# Patient Record
Sex: Female | Born: 1935
Health system: Southern US, Community
[De-identification: ages and names within clinical notes are randomized; demographics above are authoritative.]

## PROBLEM LIST (undated history)

## (undated) DIAGNOSIS — M069 Rheumatoid arthritis, unspecified: Secondary | ICD-10-CM

## (undated) DIAGNOSIS — I10 Essential (primary) hypertension: Secondary | ICD-10-CM

## (undated) DIAGNOSIS — K219 Gastro-esophageal reflux disease without esophagitis: Secondary | ICD-10-CM

## (undated) DIAGNOSIS — J449 Chronic obstructive pulmonary disease, unspecified: Secondary | ICD-10-CM

## (undated) DIAGNOSIS — N390 Urinary tract infection, site not specified: Secondary | ICD-10-CM

## (undated) DIAGNOSIS — K5900 Constipation, unspecified: Secondary | ICD-10-CM

## (undated) DIAGNOSIS — J189 Pneumonia, unspecified organism: Secondary | ICD-10-CM

## (undated) HISTORY — DX: Pneumonia, unspecified organism: J18.9

## (undated) HISTORY — DX: Gastro-esophageal reflux disease without esophagitis: K21.9

## (undated) HISTORY — DX: Constipation, unspecified: K59.00

## (undated) HISTORY — DX: Chronic obstructive pulmonary disease, unspecified: J44.9

## (undated) HISTORY — DX: Urinary tract infection, site not specified: N39.0

## (undated) HISTORY — DX: Essential (primary) hypertension: I10

## (undated) HISTORY — DX: Rheumatoid arthritis, unspecified: M06.9

---

## 2005-05-03 ENCOUNTER — Ambulatory Visit: Payer: Self-pay | Admitting: Ophthalmology

## 2005-05-10 ENCOUNTER — Ambulatory Visit: Payer: Self-pay

## 2005-10-18 ENCOUNTER — Ambulatory Visit: Payer: Self-pay | Admitting: Internal Medicine

## 2006-07-29 ENCOUNTER — Ambulatory Visit: Payer: Self-pay | Admitting: Internal Medicine

## 2006-09-02 ENCOUNTER — Ambulatory Visit: Payer: Self-pay | Admitting: Unknown Physician Specialty

## 2006-10-04 ENCOUNTER — Ambulatory Visit: Payer: Self-pay | Admitting: Unknown Physician Specialty

## 2007-06-13 ENCOUNTER — Ambulatory Visit: Payer: Self-pay | Admitting: Internal Medicine

## 2007-09-04 ENCOUNTER — Ambulatory Visit: Payer: Self-pay | Admitting: Rheumatology

## 2007-09-13 ENCOUNTER — Ambulatory Visit: Payer: Self-pay | Admitting: Rheumatology

## 2007-10-09 ENCOUNTER — Ambulatory Visit: Payer: Self-pay

## 2007-10-13 ENCOUNTER — Ambulatory Visit: Payer: Self-pay

## 2007-11-08 ENCOUNTER — Ambulatory Visit: Payer: Self-pay | Admitting: Unknown Physician Specialty

## 2007-11-28 ENCOUNTER — Ambulatory Visit: Payer: Self-pay | Admitting: Internal Medicine

## 2007-12-28 ENCOUNTER — Ambulatory Visit: Payer: Self-pay | Admitting: Rheumatology

## 2008-11-29 ENCOUNTER — Ambulatory Visit: Payer: Self-pay | Admitting: Internal Medicine

## 2009-12-01 ENCOUNTER — Ambulatory Visit: Payer: Self-pay | Admitting: Internal Medicine

## 2009-12-03 ENCOUNTER — Ambulatory Visit: Payer: Self-pay | Admitting: Internal Medicine

## 2010-01-02 ENCOUNTER — Encounter: Admission: RE | Admit: 2010-01-02 | Discharge: 2010-01-02 | Payer: Self-pay | Admitting: General Surgery

## 2010-06-12 ENCOUNTER — Ambulatory Visit: Payer: Self-pay | Admitting: Internal Medicine

## 2011-01-04 ENCOUNTER — Encounter
Admission: RE | Admit: 2011-01-04 | Discharge: 2011-01-04 | Payer: Self-pay | Source: Home / Self Care | Attending: Internal Medicine | Admitting: Internal Medicine

## 2012-04-11 ENCOUNTER — Other Ambulatory Visit: Payer: Self-pay | Admitting: Internal Medicine

## 2012-04-11 DIAGNOSIS — Z1231 Encounter for screening mammogram for malignant neoplasm of breast: Secondary | ICD-10-CM

## 2012-04-28 ENCOUNTER — Ambulatory Visit
Admission: RE | Admit: 2012-04-28 | Discharge: 2012-04-28 | Disposition: A | Discharge status: Home / Self Care | Payer: Medicare Other | Source: Ambulatory Visit | Attending: Internal Medicine | Admitting: Internal Medicine

## 2012-04-28 DIAGNOSIS — Z1231 Encounter for screening mammogram for malignant neoplasm of breast: Secondary | ICD-10-CM

## 2012-05-04 ENCOUNTER — Ambulatory Visit: Payer: Self-pay | Admitting: Rheumatology

## 2012-07-29 ENCOUNTER — Inpatient Hospital Stay: Payer: Self-pay | Admitting: Internal Medicine

## 2012-07-29 LAB — CBC
HCT: 36.5 % (ref 35.0–47.0)
HGB: 12.3 g/dL (ref 12.0–16.0)
MCH: 28.7 pg (ref 26.0–34.0)
MCHC: 33.7 g/dL (ref 32.0–36.0)
MCV: 85 fL (ref 80–100)
Platelet: 350 10*3/uL (ref 150–440)
RDW: 14.2 % (ref 11.5–14.5)
WBC: 17 10*3/uL — ABNORMAL HIGH (ref 3.6–11.0)

## 2012-07-29 LAB — URINALYSIS, COMPLETE
Bacteria: NONE SEEN
Bilirubin,UR: NEGATIVE
Blood: NEGATIVE
Ketone: NEGATIVE
Ph: 9 (ref 4.5–8.0)
RBC,UR: 5 /HPF (ref 0–5)
Specific Gravity: 1.012 (ref 1.003–1.030)
Squamous Epithelial: NONE SEEN

## 2012-07-29 LAB — COMPREHENSIVE METABOLIC PANEL
Albumin: 2.6 g/dL — ABNORMAL LOW (ref 3.4–5.0)
Anion Gap: 8 (ref 7–16)
Calcium, Total: 8.1 mg/dL — ABNORMAL LOW (ref 8.5–10.1)
EGFR (African American): >60
EGFR (Non-African Amer.): >60
Glucose: 105 mg/dL — ABNORMAL HIGH (ref 65–99)
Osmolality: 264 (ref 275–301)
Potassium: 4.2 mmol/L (ref 3.5–5.1)
Sodium: 133 mmol/L — ABNORMAL LOW (ref 136–145)
Total Protein: 7.8 g/dL (ref 6.4–8.2)

## 2012-07-29 LAB — TROPONIN I: Troponin-I: <0.02 ng/mL

## 2012-07-30 LAB — BASIC METABOLIC PANEL
Anion Gap: 8 (ref 7–16)
Calcium, Total: 7.8 mg/dL — ABNORMAL LOW (ref 8.5–10.1)
Co2: 24 mmol/L (ref 21–32)
Creatinine: 0.58 mg/dL — ABNORMAL LOW (ref 0.60–1.30)
EGFR (African American): >60
EGFR (Non-African Amer.): >60
Glucose: 99 mg/dL (ref 65–99)
Potassium: 3.5 mmol/L (ref 3.5–5.1)

## 2012-07-30 LAB — CBC WITH DIFFERENTIAL/PLATELET
Basophil %: 0.3 %
Eosinophil #: 0 10*3/uL (ref 0.0–0.7)
Eosinophil %: 0.4 %
HGB: 11.3 g/dL — ABNORMAL LOW (ref 12.0–16.0)
Lymphocyte #: 0.7 10*3/uL — ABNORMAL LOW (ref 1.0–3.6)
Lymphocyte %: 5.9 %
MCV: 86 fL (ref 80–100)
Monocyte %: 4.5 %
Neutrophil #: 10.2 10*3/uL — ABNORMAL HIGH (ref 1.4–6.5)
Neutrophil %: 88.9 %
RBC: 3.98 10*6/uL (ref 3.80–5.20)

## 2012-07-30 LAB — URINE CULTURE

## 2012-07-31 LAB — CBC WITH DIFFERENTIAL/PLATELET
Basophil #: 0.1 10*3/uL (ref 0.0–0.1)
Basophil %: 0.5 %
HGB: 11.5 g/dL — ABNORMAL LOW (ref 12.0–16.0)
Lymphocyte %: 7.9 %
MCH: 28 pg (ref 26.0–34.0)
MCHC: 33 g/dL (ref 32.0–36.0)
Monocyte #: 0.6 x10 3/mm (ref 0.2–0.9)
Monocyte %: 4.9 %
Neutrophil #: 10.2 10*3/uL — ABNORMAL HIGH (ref 1.4–6.5)
Neutrophil %: 86.4 %
Platelet: 316 10*3/uL (ref 150–440)
RBC: 4.09 10*6/uL (ref 3.80–5.20)
RDW: 14 % (ref 11.5–14.5)
WBC: 11.8 10*3/uL — ABNORMAL HIGH (ref 3.6–11.0)

## 2012-07-31 LAB — BASIC METABOLIC PANEL
Chloride: 100 mmol/L (ref 98–107)
Co2: 24 mmol/L (ref 21–32)
EGFR (African American): >60
Glucose: 111 mg/dL — ABNORMAL HIGH (ref 65–99)
Osmolality: 265 (ref 275–301)

## 2012-08-01 LAB — POTASSIUM: Potassium: 3.4 mmol/L — ABNORMAL LOW (ref 3.5–5.1)

## 2012-08-01 LAB — MAGNESIUM: Magnesium: 1.9 mg/dL

## 2012-08-02 LAB — CBC WITH DIFFERENTIAL/PLATELET
Basophil %: 0.4 %
Eosinophil #: 0.1 10*3/uL (ref 0.0–0.7)
Eosinophil %: 1.6 %
HCT: 33.8 % — ABNORMAL LOW (ref 35.0–47.0)
HGB: 11.5 g/dL — ABNORMAL LOW (ref 12.0–16.0)
Lymphocyte #: 1.1 10*3/uL (ref 1.0–3.6)
MCV: 84 fL (ref 80–100)
Monocyte #: 0.7 x10 3/mm (ref 0.2–0.9)
Monocyte %: 8.1 %
Neutrophil #: 6.9 10*3/uL — ABNORMAL HIGH (ref 1.4–6.5)
RBC: 4.02 10*6/uL (ref 3.80–5.20)
WBC: 8.9 10*3/uL (ref 3.6–11.0)

## 2012-08-02 LAB — BASIC METABOLIC PANEL
Anion Gap: 6 — ABNORMAL LOW (ref 7–16)
BUN: 4 mg/dL — ABNORMAL LOW (ref 7–18)
Calcium, Total: 8.1 mg/dL — ABNORMAL LOW (ref 8.5–10.1)
Chloride: 96 mmol/L — ABNORMAL LOW (ref 98–107)
Co2: 30 mmol/L (ref 21–32)
Osmolality: 261 (ref 275–301)
Potassium: 3.4 mmol/L — ABNORMAL LOW (ref 3.5–5.1)

## 2013-05-15 ENCOUNTER — Non-Acute Institutional Stay (SKILLED_NURSING_FACILITY): Payer: Medicare Other | Admitting: Nurse Practitioner

## 2013-05-15 ENCOUNTER — Encounter: Payer: Self-pay | Admitting: Nurse Practitioner

## 2013-05-15 DIAGNOSIS — N390 Urinary tract infection, site not specified: Secondary | ICD-10-CM

## 2013-05-15 DIAGNOSIS — J449 Chronic obstructive pulmonary disease, unspecified: Secondary | ICD-10-CM

## 2013-05-15 DIAGNOSIS — M25569 Pain in unspecified knee: Secondary | ICD-10-CM

## 2013-05-15 DIAGNOSIS — M25562 Pain in left knee: Secondary | ICD-10-CM

## 2013-05-15 DIAGNOSIS — E538 Deficiency of other specified B group vitamins: Secondary | ICD-10-CM | POA: Insufficient documentation

## 2013-05-15 DIAGNOSIS — I10 Essential (primary) hypertension: Secondary | ICD-10-CM

## 2013-05-15 DIAGNOSIS — M069 Rheumatoid arthritis, unspecified: Secondary | ICD-10-CM

## 2013-05-15 DIAGNOSIS — K219 Gastro-esophageal reflux disease without esophagitis: Secondary | ICD-10-CM

## 2013-05-15 DIAGNOSIS — K5909 Other constipation: Secondary | ICD-10-CM | POA: Insufficient documentation

## 2013-05-15 DIAGNOSIS — F329 Major depressive disorder, single episode, unspecified: Secondary | ICD-10-CM | POA: Insufficient documentation

## 2013-05-15 DIAGNOSIS — K5900 Constipation, unspecified: Secondary | ICD-10-CM

## 2013-05-15 HISTORY — DX: Rheumatoid arthritis, unspecified: M06.9

## 2013-05-15 HISTORY — DX: Gastro-esophageal reflux disease without esophagitis: K21.9

## 2013-05-15 HISTORY — DX: Essential (primary) hypertension: I10

## 2013-05-15 HISTORY — DX: Chronic obstructive pulmonary disease, unspecified: J44.9

## 2013-05-15 HISTORY — DX: Constipation, unspecified: K59.00

## 2013-05-15 HISTORY — DX: Urinary tract infection, site not specified: N39.0

## 2013-05-15 NOTE — Progress Notes (Signed)
  Subjective:    Patient ID: Kristen [name], female    DOB: 1934/12/26, 78 y.o.   MRN: [id number]  HPI Comments: Pt was seen today for left knee pain and swelling. Pt's sister says that pt has seen Dr. Ashok Norris Rheumatologist at [facility] for rheumatoid arthritis (on po Methotrexate), last summer for left knee aspiration.  Knee Pain  The incident occurred 3 to 5 days ago. The incident occurred at a nursing home. The injury mechanism is unknown. The pain is present in the left knee. The quality of the pain is described as aching. The pain is at a severity of 6/10. The pain is moderate. The pain has been constant since onset. Pertinent negatives include no loss of motion, loss of sensation, numbness or tingling. She reports no foreign bodies present. The symptoms are aggravated by movement, palpation and weight bearing. She has tried heat (tramadol prn.) for the symptoms. The treatment provided mild relief.      Review of Systems  Unable to perform ROS: Dementia  Neurological: Negative for tingling and numbness.       Objective:   Physical Exam  Vitals reviewed. Constitutional: She appears well-developed and well-nourished. No distress.  HENT:  Head: Normocephalic.  Eyes: Pupils are equal, round, and reactive to light.  Cardiovascular: Normal rate, regular rhythm and normal heart sounds.   Pulmonary/Chest: Effort normal and breath sounds normal.  Musculoskeletal: She exhibits edema and tenderness.  Neurological: She is alert.  Skin: Skin is warm and dry. She is not diaphoretic.  Psychiatric: She has a normal mood and affect.          Assessment & Plan:  Pt was seen for left knee pain and swelling, unknown if fall, trauma was involved. Pt unable to give hx due to dementia.  1) Will check cbc, bmp, uric acid level to r/o gout, in am. 2) check temp q shift x 3 days. 3) xray left knee due to swelling and pain. 4) will review labs and xrays, will send to rheumatologist if no  improvement for hx of RA.

## 2013-05-18 ENCOUNTER — Encounter: Payer: Self-pay | Admitting: Nurse Practitioner

## 2013-05-21 ENCOUNTER — Encounter: Payer: Self-pay | Admitting: Nurse Practitioner

## 2013-05-22 ENCOUNTER — Other Ambulatory Visit: Payer: Self-pay | Admitting: Rheumatology

## 2013-05-22 LAB — BODY FLUID CELL COUNT WITH DIFFERENTIAL
Basophil: 0 %
Eosinophil: 0 %
Lymphocytes: 5 %
Neutrophils: 85 %
Other Cells BF: 0 %
Other Mononuclear Cells: 10 %

## 2013-05-22 LAB — SYNOVIAL FLUID, CRYSTAL

## 2013-07-19 ENCOUNTER — Encounter: Payer: Self-pay | Admitting: Adult Health

## 2013-07-19 ENCOUNTER — Non-Acute Institutional Stay (SKILLED_NURSING_FACILITY): Payer: Medicare Other | Admitting: Adult Health

## 2013-07-19 DIAGNOSIS — I10 Essential (primary) hypertension: Secondary | ICD-10-CM

## 2013-07-19 DIAGNOSIS — K219 Gastro-esophageal reflux disease without esophagitis: Secondary | ICD-10-CM

## 2013-07-19 DIAGNOSIS — K5900 Constipation, unspecified: Secondary | ICD-10-CM

## 2013-07-19 DIAGNOSIS — M069 Rheumatoid arthritis, unspecified: Secondary | ICD-10-CM

## 2013-07-19 DIAGNOSIS — F329 Major depressive disorder, single episode, unspecified: Secondary | ICD-10-CM

## 2013-07-30 NOTE — Assessment & Plan Note (Signed)
Is stable will continue miralax daily and senna s 2 tabs daily

## 2013-07-30 NOTE — Assessment & Plan Note (Signed)
Her status is tsable will continue methotrexate 12.5 mg weekly with folic acid 2 mg daily

## 2013-07-30 NOTE — Assessment & Plan Note (Signed)
Is stable will continue protonix 40 mg twice daily and will monitor

## 2013-07-30 NOTE — Progress Notes (Signed)
Patient ID: Kristen [name], female   DOB: November 05, 1935, 78 y.o.   MRN: [id number]  [facility]  Allergies  Allergen Reactions  . Erythromycin Base   . Penicillins   . Sulfa Antibiotics       Chief Complaint  Patient presents with  . Medical Managment of Chronic Issues    HPI: She is being seen for the management of her chronic illnesses. There are no concerns being voiced by the nursing staff at this time. Overall her status remains unchanged.   Past Medical History  Diagnosis Date  . RA (rheumatoid arthritis) 05/15/2013  . HTN (hypertension) 05/15/2013  . GERD (gastroesophageal reflux disease) 05/15/2013  . COPD (chronic obstructive pulmonary disease) 05/15/2013  . Unspecified constipation 05/15/2013  . UTI (urinary tract infection) 05/15/2013    No past surgical history on file.  VITAL SIGNS BP 110/63  Pulse 70  Ht 5\' 5"  (1.651 m)  Wt 107 lb (48.535 kg)  BMI 17.81 kg/m2   Patient's Medications  New Prescriptions   No medications on file  Previous Medications   ACETAMINOPHEN (TYLENOL) 500 MG TABLET    Take 500 mg by mouth 2 (two) times daily.   AMLODIPINE (NORVASC) 5 MG TABLET    Take 5 mg by mouth daily.   CITALOPRAM (CELEXA) 20 MG TABLET    Take 20 mg by mouth daily.   CYANOCOBALAMIN (VITAMIN B-12 IJ)    Inject 1,000 mg as directed every 30 (thirty) days.   FOLIC ACID (FOLVITE) 1 MG TABLET    Take 1 mg by mouth daily.    METHOTREXATE (RHEUMATREX) 2.5 MG TABLET    Take 12.5 mg by mouth once a week.    METOCLOPRAMIDE (REGLAN) 5 MG TABLET    Take 2.5 mg by mouth 4 (four) times daily.    PANTOPRAZOLE (PROTONIX) 40 MG TABLET    Take 40 mg by mouth 2 (two) times daily.   POLYETHYLENE GLYCOL (MIRALAX / GLYCOLAX) PACKET    Take 17 g by mouth daily.   SENNOSIDES-DOCUSATE SODIUM (SENOKOT-S) 8.6-50 MG TABLET    Take 2 tablets by mouth at bedtime.    TRAMADOL (ULTRAM) 50 MG TABLET    Take 50 mg by mouth every 8 (eight) hours as needed for pain.  Modified Medications   No  medications on file  Discontinued Medications   POLYETHYLENE GLYCOL (MIRALAX / GLYCOLAX) PACKET    Take 17 g by mouth daily.    SIGNIFICANT DIAGNOSTIC EXAMS  05-22-13: Left knee x-ray: moderate to severe osteopenia; mild osteoarthritis no acute fracture malalignment.sall joint effusion  LABS REVIEWED;   05-15-13: urine culture: e-coli: macrobid 05-16-13: wbc 6.3; hgb 11.7; hct 352. ;mcv 85.4 ;plt 356; glucose 93; bun 11; creat 0.63; k+3.7 Na++137    Review of Systems  Constitutional: Negative for malaise/fatigue.  Respiratory: Negative for cough.   Cardiovascular: Negative for chest pain.  Gastrointestinal: Negative for heartburn.  Musculoskeletal: Negative for myalgias.  Skin: Negative.   Psychiatric/Behavioral: The patient does not have insomnia.     Physical Exam  Constitutional:  frail  Neck: Neck supple. No thyromegaly present.  Cardiovascular: Normal rate, regular rhythm and intact distal pulses.   Respiratory: Effort normal and breath sounds normal. No respiratory distress. She has no wheezes.  GI: Soft. Bowel sounds are normal. She exhibits no distension. There is no tenderness.  Musculoskeletal: She exhibits no edema.  Is able to move extremities  Neurological: She is alert.  Skin: Skin is warm.  ASSESSMENT/ PLAN:  HTN (hypertension) Is stable will continue norvasc 5 mg daily and will monitor  GERD (gastroesophageal reflux disease) Is stable will continue protonix 40 mg twice daily and will monitor  Depressive disorder, not elsewhere classified Her mood status is stable will continue celexa 20 mg daily   Unspecified constipation Is stable will continue miralax daily and senna s 2 tabs daily   RA (rheumatoid arthritis) Her status is tsable will continue methotrexate 12.5 mg weekly with folic acid 2 mg daily    Time spent with patient 50 minutes.

## 2013-07-30 NOTE — Assessment & Plan Note (Signed)
Is stable will continue norvasc 5 mg daily and will monitor

## 2013-07-30 NOTE — Assessment & Plan Note (Addendum)
Her mood status is stable will continue celexa 20 mg daily

## 2013-08-11 ENCOUNTER — Emergency Department (HOSPITAL_COMMUNITY): Payer: Medicare Other

## 2013-08-11 ENCOUNTER — Encounter (HOSPITAL_COMMUNITY): Payer: Self-pay | Admitting: *Deleted

## 2013-08-11 ENCOUNTER — Emergency Department (HOSPITAL_COMMUNITY)
Admission: EM | Admit: 2013-08-11 | Discharge: 2013-08-11 | Disposition: A | Discharge status: Other Acute Inpatient Hospital | Payer: Medicare Other | Attending: Emergency Medicine | Admitting: Emergency Medicine

## 2013-08-11 DIAGNOSIS — K5900 Constipation, unspecified: Secondary | ICD-10-CM | POA: Insufficient documentation

## 2013-08-11 DIAGNOSIS — Z880 Allergy status to penicillin: Secondary | ICD-10-CM | POA: Insufficient documentation

## 2013-08-11 DIAGNOSIS — R404 Transient alteration of awareness: Secondary | ICD-10-CM | POA: Insufficient documentation

## 2013-08-11 DIAGNOSIS — J449 Chronic obstructive pulmonary disease, unspecified: Secondary | ICD-10-CM | POA: Insufficient documentation

## 2013-08-11 DIAGNOSIS — N390 Urinary tract infection, site not specified: Secondary | ICD-10-CM | POA: Insufficient documentation

## 2013-08-11 DIAGNOSIS — Z79899 Other long term (current) drug therapy: Secondary | ICD-10-CM | POA: Insufficient documentation

## 2013-08-11 DIAGNOSIS — J4489 Other specified chronic obstructive pulmonary disease: Secondary | ICD-10-CM | POA: Insufficient documentation

## 2013-08-11 DIAGNOSIS — K219 Gastro-esophageal reflux disease without esophagitis: Secondary | ICD-10-CM | POA: Insufficient documentation

## 2013-08-11 DIAGNOSIS — F0390 Unspecified dementia without behavioral disturbance: Secondary | ICD-10-CM | POA: Insufficient documentation

## 2013-08-11 DIAGNOSIS — I10 Essential (primary) hypertension: Secondary | ICD-10-CM | POA: Insufficient documentation

## 2013-08-11 DIAGNOSIS — M069 Rheumatoid arthritis, unspecified: Secondary | ICD-10-CM | POA: Insufficient documentation

## 2013-08-11 DIAGNOSIS — Z87891 Personal history of nicotine dependence: Secondary | ICD-10-CM | POA: Insufficient documentation

## 2013-08-11 LAB — CBC
Hemoglobin: 13 g/dL (ref 12.0–15.0)
MCHC: 35.1 g/dL (ref 30.0–36.0)
RBC: 4.18 MIL/uL (ref 3.87–5.11)
WBC: 11.5 10*3/uL — ABNORMAL HIGH (ref 4.0–10.5)

## 2013-08-11 LAB — COMPREHENSIVE METABOLIC PANEL
ALT: 13 U/L (ref 0–35)
AST: 22 U/L (ref 0–37)
Calcium: 8.8 mg/dL (ref 8.4–10.5)
GFR calc Af Amer: >90 mL/min (ref >90)
Glucose, Bld: 123 mg/dL — ABNORMAL HIGH (ref 70–99)
Sodium: 135 mEq/L (ref 135–145)
Total Protein: 6.9 g/dL (ref 6.0–8.3)

## 2013-08-11 LAB — URINALYSIS, ROUTINE W REFLEX MICROSCOPIC
Hgb urine dipstick: NEGATIVE
Ketones, ur: NEGATIVE mg/dL
Specific Gravity, Urine: 1.012 (ref 1.005–1.030)

## 2013-08-11 LAB — URINE MICROSCOPIC-ADD ON

## 2013-08-11 MED ORDER — CIPROFLOXACIN HCL 500 MG PO TABS: 500.0000 mg | ORAL_TABLET | Freq: Two times a day (BID) | ORAL | Status: DC

## 2013-08-11 MED ORDER — CIPROFLOXACIN HCL 500 MG PO TABS
500.0000 mg | ORAL_TABLET | Freq: Two times a day (BID) | ORAL | Status: DC
  Administered 2013-08-11: 500 mg via ORAL
  Filled 2013-08-11: qty 1

## 2013-08-11 NOTE — ED Notes (Signed)
Report given to [facility]. Facility ready to accept patient at this time. [facility] called to transport patient.

## 2013-08-11 NOTE — ED Notes (Signed)
Per EMS, Staff at nursing home stated pt had a period of LOC that lasted approximatly 10 minutes.  Per staff the pt had drool coming from left side of her mouth and was completely unresponsive.  Stated pt come out of LOC one her own without any intervention and now has equal and strong grip strength and had no symptoms.

## 2013-08-11 NOTE — ED Provider Notes (Signed)
CSN: [id number]     Arrival date & time 08/11/13  4098 History     First MD Initiated Contact with Patient 08/11/13 0104     Chief Complaint  Patient presents with  . Loss of Consciousness   (Consider location/radiation/quality/duration/timing/severity/associated sxs/prior Treatment) HPI 78 yo female presents to the ER from nursing home via EMS with report of LOC with drooling lasting about 10 minutes.  Event occurred around 1130.  Pt was in bed, and staff were unable to rouse her.  Daughter reports pt is a heavy sleeper, and often drools when she sleeps.  She is usually asleep by 8 pm.  It is unclear why the nursing home woke her at this time.  Pt has dementia, cannot give any history.  No prior stroke history.  Daughter reports pt is at baseline.  Past Medical History  Diagnosis Date  . RA (rheumatoid arthritis) 05/15/2013  . HTN (hypertension) 05/15/2013  . GERD (gastroesophageal reflux disease) 05/15/2013  . COPD (chronic obstructive pulmonary disease) 05/15/2013  . Unspecified constipation 05/15/2013  . UTI (urinary tract infection) 05/15/2013   History reviewed. No pertinent past surgical history. No family history on file. History  Substance Use Topics  . Smoking status: Former Games developer  . Smokeless tobacco: Not on file  . Alcohol Use: No   OB History   Grav Para Term Preterm Abortions TAB SAB Ect Mult Living                 Review of Systems  Unable to perform ROS: Dementia    Allergies  Erythromycin base; Penicillins; and Sulfa antibiotics  Home Medications   Current Outpatient Rx  Name  Route  Sig  Dispense  Refill  . acetaminophen (TYLENOL) 500 MG tablet   Oral   Take 500 mg by mouth 2 (two) times daily. 0900 & 1700         . amLODipine (NORVASC) 5 MG tablet   Oral   Take 5 mg by mouth every morning.          . citalopram (CELEXA) 20 MG tablet   Oral   Take 20 mg by mouth every morning.          . Cyanocobalamin (VITAMIN B-12 IJ)   Injection  Inject 1,000 mg as directed every 30 (thirty) days. 15th of each month         . folic acid (FOLVITE) 1 MG tablet   Oral   Take 1 mg by mouth 2 (two) times daily. 0800 & 1700         . methotrexate (RHEUMATREX) 2.5 MG tablet   Oral   Take 12.5 mg by mouth once a week. mondays         . metoCLOPramide (REGLAN) 5 MG tablet   Oral   Take 2.5 mg by mouth 4 (four) times daily.          . pantoprazole (PROTONIX) 40 MG tablet   Oral   Take 40 mg by mouth 2 (two) times daily. 0630 & 1630         . polyethylene glycol (MIRALAX / GLYCOLAX) packet   Oral   Take 17 g by mouth every morning.          . sennosides-docusate sodium (SENOKOT-S) 8.6-50 MG tablet   Oral   Take 2 tablets by mouth at bedtime.          . traMADol (ULTRAM) 50 MG tablet   Oral   Take 50 mg  by mouth every 8 (eight) hours as needed for pain.         . ciprofloxacin (CIPRO) 500 MG tablet   Oral   Take 1 tablet (500 mg total) by mouth 2 (two) times daily.   13 tablet   0    BP 119/68  Pulse 89  Temp(Src) 98.2 F (36.8 C) (Oral)  Resp 12  SpO2 96% Physical Exam  Nursing note and vitals reviewed. Constitutional: She appears well-developed and well-nourished. No distress.  HENT:  Head: Normocephalic and atraumatic.  Right Ear: External ear normal.  Left Ear: External ear normal.  Nose: Nose normal.  Mouth/Throat: Oropharynx is clear and moist.  Eyes: Conjunctivae and EOM are normal. Pupils are equal, round, and reactive to light.  Neck: Normal range of motion. Neck supple. No JVD present. No tracheal deviation present. No thyromegaly present.  Cardiovascular: Normal rate, regular rhythm, normal heart sounds and intact distal pulses.  Exam reveals no gallop and no friction rub.   No murmur heard. Pulmonary/Chest: Effort normal and breath sounds normal. No stridor. No respiratory distress. She has no wheezes. She has no rales. She exhibits no tenderness.  Abdominal: Soft. Bowel sounds are  normal. She exhibits no distension and no mass. There is no tenderness. There is no rebound and no guarding.  Musculoskeletal: Normal range of motion. She exhibits no edema and no tenderness.  Lymphadenopathy:    She has no cervical adenopathy.  Neurological: She is alert. She exhibits normal muscle tone. Coordination normal.  Skin: Skin is warm and dry. No rash noted. No erythema. No pallor.  Psychiatric: She has a normal mood and affect. Her behavior is normal. Judgment and thought content normal.    ED Course   Procedures (including critical care time)  Labs Reviewed  URINALYSIS, ROUTINE W REFLEX MICROSCOPIC - Abnormal; Notable for the following:    APPearance CLOUDY (*)    Leukocytes, UA LARGE (*)    All other components within normal limits  CBC - Abnormal; Notable for the following:    WBC 11.5 (*)    All other components within normal limits  COMPREHENSIVE METABOLIC PANEL - Abnormal; Notable for the following:    Potassium 3.3 (*)    Glucose, Bld 123 (*)    Albumin 3.1 (*)    Total Bilirubin 0.2 (*)    GFR calc non Af Amer 84 (*)    All other components within normal limits  URINE MICROSCOPIC-ADD ON - Abnormal; Notable for the following:    Bacteria, UA MANY (*)    All other components within normal limits  URINE CULTURE  PROTIME-INR  POCT I-STAT TROPONIN I   Ct Head Wo Contrast  08/11/2013   CLINICAL DATA:  Loss of consciousness. Dementia.  EXAM: CT HEAD WITHOUT CONTRAST  TECHNIQUE: Contiguous axial images were obtained from the base of the skull through the vertex without intravenous contrast.  COMPARISON:  None.  FINDINGS: Diffusely enlarged ventricles and subarachnoid spaces. Patchy white matter low density in both cerebral hemispheres. No intracranial hemorrhage, mass lesion or CT evidence of acute infarction. Unremarkable bones. Marked left sphenoid sinus mucosal thickening with some central calcification. Mild are central line right sphenoid sinus mucosal thickening.   IMPRESSION: 1. No acute abnormality. 2. Moderate atrophy and chronic small vessel white matter ischemic changes. 3. Chronic sphenoid sinusitis. This includes the possibility of fungal sinusitis on the left.   Electronically Signed   By: Gordan Payment   On: 08/11/2013 02:04  Date: 08/11/2013  Rate: 93  Rhythm: normal sinus rhythm  QRS Axis: left  Intervals: PR prolonged  ST/T Wave abnormalities: normal  Conduction Disutrbances:none  Narrative Interpretation:   Old EKG Reviewed: none available   1. Urinary tract infection   2. Altered level of consciousness     MDM  78 yo female with episode of unclear unresponsiveness, syncope vs tia vs heavy sleep.  Workup here shows UTI.  Daughter is comfortable with limited workup and no admission, Does not want aggressive intervention.  Olivia Mackie, MD 08/11/13 [phone]

## 2013-08-13 NOTE — Progress Notes (Signed)
This encounter was created in error - please disregard.

## 2013-08-14 LAB — URINE CULTURE: Colony Count: >=100000

## 2013-08-15 NOTE — ED Notes (Signed)
Copy of labs faxed to [facility]

## 2013-08-15 NOTE — Progress Notes (Signed)
,   ED Antimicrobial Stewardship Positive Culture Follow Up   Kristen [name] is an 77 y.o. female who presented to [facility] on 08/11/2013 with a chief complaint of  Chief Complaint  Patient presents with  . Loss of Consciousness    Recent Results (from the past 720 hour(s))  URINE CULTURE     Status: None   Collection Time    08/11/13  2:59 AM      Result Value Range Status   Specimen Description URINE, RANDOM   Final   Special Requests Normal   Final   Culture  Setup Time     Final   Value: 08/11/2013 15:53     Performed at [facility] Count     Final   Value: >=100,000 COLONIES/ML     Performed at [facility]   Culture     Final   Value: ESCHERICHIA COLI     Performed at [facility]   Report Status 08/14/2013 FINAL   Final   Organism ID, Bacteria ESCHERICHIA COLI   Final    [x]  Treated with Cipro, organism resistant to prescribed antimicrobial  New antibiotic prescription: Levaquin 750mg  PO qday x 5 days.  Patient should stop taking Cipro.  ED Provider: Marlon Pel, PA-C  Sallee Provencal 08/15/2013, 11:24 AM Infectious Diseases Pharmacist Phone# [phone]

## 2013-09-25 ENCOUNTER — Other Ambulatory Visit: Payer: Self-pay | Admitting: *Deleted

## 2013-09-25 MED ORDER — LORAZEPAM 2 MG/ML IJ SOLN: INTRAMUSCULAR | Status: DC

## 2014-01-08 ENCOUNTER — Other Ambulatory Visit: Payer: Self-pay

## 2014-01-08 ENCOUNTER — Non-Acute Institutional Stay (SKILLED_NURSING_FACILITY): Payer: Medicare Other | Admitting: Adult Health

## 2014-01-08 DIAGNOSIS — F329 Major depressive disorder, single episode, unspecified: Secondary | ICD-10-CM

## 2014-01-08 DIAGNOSIS — K5900 Constipation, unspecified: Secondary | ICD-10-CM

## 2014-01-08 DIAGNOSIS — J449 Chronic obstructive pulmonary disease, unspecified: Secondary | ICD-10-CM

## 2014-01-08 DIAGNOSIS — J189 Pneumonia, unspecified organism: Secondary | ICD-10-CM

## 2014-01-08 DIAGNOSIS — K219 Gastro-esophageal reflux disease without esophagitis: Secondary | ICD-10-CM

## 2014-01-08 DIAGNOSIS — R509 Fever, unspecified: Secondary | ICD-10-CM

## 2014-01-08 DIAGNOSIS — I10 Essential (primary) hypertension: Secondary | ICD-10-CM

## 2014-01-08 DIAGNOSIS — M069 Rheumatoid arthritis, unspecified: Secondary | ICD-10-CM

## 2014-01-08 DIAGNOSIS — F3289 Other specified depressive episodes: Secondary | ICD-10-CM

## 2014-01-08 LAB — URINALYSIS, COMPLETE
Bilirubin,UR: NEGATIVE
Glucose,UR: NEGATIVE mg/dL (ref 0–75)
Hyaline Cast: 15
Ketone: NEGATIVE
Nitrite: POSITIVE
Ph: 5 (ref 4.5–8.0)
RBC,UR: 19 /HPF (ref 0–5)
Specific Gravity: 1.014 (ref 1.003–1.030)
Squamous Epithelial: NONE SEEN
WBC UR: 254 /HPF (ref 0–5)

## 2014-01-08 LAB — COMPREHENSIVE METABOLIC PANEL
ALBUMIN: 3.4 g/dL (ref 3.4–5.0)
ALK PHOS: 79 U/L
ALT: 26 U/L (ref 12–78)
ANION GAP: 10 (ref 7–16)
AST: 28 U/L (ref 15–37)
BUN: 8 mg/dL (ref 7–18)
Bilirubin,Total: 0.4 mg/dL (ref 0.2–1.0)
CALCIUM: 9.1 mg/dL (ref 8.5–10.1)
Chloride: 100 mmol/L (ref 98–107)
Co2: 27 mmol/L (ref 21–32)
Creatinine: 1.05 mg/dL (ref 0.60–1.30)
EGFR (Non-African Amer.): 52 — ABNORMAL LOW
GFR CALC AF AMER: 60 — AB
Glucose: 130 mg/dL — ABNORMAL HIGH (ref 65–99)
Osmolality: 274 (ref 275–301)
Potassium: 4.1 mmol/L (ref 3.5–5.1)
SODIUM: 137 mmol/L (ref 136–145)
Total Protein: 7 g/dL (ref 6.4–8.2)

## 2014-01-08 LAB — CBC WITH DIFFERENTIAL/PLATELET
BASOS ABS: 0 10*3/uL (ref 0.0–0.1)
BASOS PCT: 0.2 %
Eosinophil #: 0 10*3/uL (ref 0.0–0.7)
Eosinophil %: 0 %
HCT: 40 % (ref 35.0–47.0)
HGB: 13.2 g/dL (ref 12.0–16.0)
Lymphocyte #: 0.3 10*3/uL — ABNORMAL LOW (ref 1.0–3.6)
Lymphocyte %: 1.9 %
MCH: 30.5 pg (ref 26.0–34.0)
MCHC: 33 g/dL (ref 32.0–36.0)
MCV: 92 fL (ref 80–100)
MONOS PCT: 2.7 %
Monocyte #: 0.5 x10 3/mm (ref 0.2–0.9)
NEUTROS PCT: 95.2 %
Neutrophil #: 16.6 10*3/uL — ABNORMAL HIGH (ref 1.4–6.5)
PLATELETS: 240 10*3/uL (ref 150–440)
RBC: 4.34 10*6/uL (ref 3.80–5.20)
RDW: 15.1 % — AB (ref 11.5–14.5)
WBC: 17.5 10*3/uL — AB (ref 3.6–11.0)

## 2014-01-10 LAB — URINE CULTURE

## 2014-01-11 ENCOUNTER — Non-Acute Institutional Stay (SKILLED_NURSING_FACILITY): Payer: Medicare Other | Admitting: Adult Health

## 2014-01-11 DIAGNOSIS — N390 Urinary tract infection, site not specified: Secondary | ICD-10-CM

## 2014-01-11 DIAGNOSIS — M549 Dorsalgia, unspecified: Secondary | ICD-10-CM

## 2014-01-11 MED ORDER — LIDOCAINE 5 % EX PTCH
1.0000 | MEDICATED_PATCH | CUTANEOUS | Status: DC
Start: 2014-01-11 — End: 2015-09-24

## 2014-01-11 NOTE — Progress Notes (Signed)
Patient ID: Kristen [name], female   DOB: 1935/02/24, 79 y.o.   MRN: [id number]     [facility]  Allergies  Allergen Reactions  . Erythromycin Base     Per MAR  . Penicillins     Per MAR  . Sulfa Antibiotics     Per [facility]     Chief Complaint  Patient presents with  . Acute Visit    HPI: She is complaining of back pain. She had been on ultram as needed in the past. The nursing staff reports that this medication was not effective for her. She tells me that her lower back hurts across the entire lower back. She is not voicing other complaints at this time. Her urine culture returned demonstrating e-coli.   Past Medical History  Diagnosis Date  . RA (rheumatoid arthritis) 05/15/2013  . HTN (hypertension) 05/15/2013  . GERD (gastroesophageal reflux disease) 05/15/2013  . COPD (chronic obstructive pulmonary disease) 05/15/2013  . Unspecified constipation 05/15/2013  . UTI (urinary tract infection) 05/15/2013    No past surgical history on file.  VITAL SIGNS There were no vitals taken for this visit.   Patient's Medications  New Prescriptions   No medications on file  Previous Medications   ACETAMINOPHEN (TYLENOL) 500 MG TABLET    Take 500 mg by mouth 2 (two) times daily. 0900 & 1700   CITALOPRAM (CELEXA) 20 MG TABLET    Take 20 mg by mouth every morning.    CYANOCOBALAMIN (VITAMIN B-12 IJ)    Inject 1,000 mg as directed every 30 (thirty) days. 15th of each month   FOLIC ACID (FOLVITE) 1 MG TABLET    Take 1 mg by mouth 2 (two) times daily. 0800 & 1700   LORAZEPAM (ATIVAN) 2 MG/ML INJECTION    Administer 0.24ml intramuscularly 30 minutes prior to dental appointment   METHOTREXATE (RHEUMATREX) 2.5 MG TABLET    Take 12.5 mg by mouth once a week. mondays   METOCLOPRAMIDE (REGLAN) 5 MG TABLET    Take 2.5 mg by mouth 4 (four) times daily.    PANTOPRAZOLE (PROTONIX) 40 MG TABLET    Take 40 mg by mouth daily. 0630 & 1630   POLYETHYLENE GLYCOL (MIRALAX / GLYCOLAX) PACKET    Take 17 g by  mouth every morning.    SENNOSIDES-DOCUSATE SODIUM (SENOKOT-S) 8.6-50 MG TABLET    Take 2 tablets by mouth at bedtime.   Modified Medications   No medications on file  Discontinued Medications   AMLODIPINE (NORVASC) 5 MG TABLET    Take 5 mg by mouth every morning.    CIPROFLOXACIN (CIPRO) 500 MG TABLET    Take 1 tablet (500 mg total) by mouth 2 (two) times daily.   TRAMADOL (ULTRAM) 50 MG TABLET    Take 50 mg by mouth every 8 (eight) hours as needed for pain.    SIGNIFICANT DIAGNOSTIC EXAMS  05-22-13: Left knee x-ray: moderate to severe osteopenia; mild osteoarthritis no acute fracture malalignment.sall joint effusion  01-08-14: chest x-ray: borderline heart size with slightly elevated pulmonary vasculature suggest mild chf. Very fine interstitial changes both lungs consistent with either pneumonia or interstitial pulmonary edema.     LABS REVIEWED;   05-15-13: urine culture: e-coli: macrobid 05-16-13: wbc 6.3; hgb 11.7; hct 352. ;mcv 85.4 ;plt 356; glucose 93; bun 11; creat 0.63; k+3.7 Na++137 07-20-13: glucose 88; bun 7; creat 0.6; k+3.6; na++ 137; vit b12: 1383 01-08-14; wbc 17.5; hgb 13.2; hct 40.0; mcv 92 plt 240; glucose 130; bun 8;  creat 1.08; k+4.1; na++137 liver normal albumin 3.4; u/a: + nitrate  Blood culture: no growth Urine culture: e-coli:  macorbid   Review of Systems  Constitutional: Negative.   Respiratory: Negative for cough.   Cardiovascular: Negative for chest pain.  Musculoskeletal: Positive for back pain and myalgias.  Skin: Negative.     Physical Exam  Constitutional: No distress.  frail  Neck: Neck supple. No JVD present.  Cardiovascular: Normal rate, regular rhythm and intact distal pulses.   Respiratory: Effort normal. No respiratory distress.  Slightly diminished breath sounds  GI: Soft. Bowel sounds are normal. She exhibits no distension. There is no tenderness.  Musculoskeletal: Normal range of motion.   Has bilateral tenderness to lower back to  palpation. Is able to get self upright in bed without difficulty.  Uses wheelchair  Neurological: She is alert.  Skin: Skin is dry. She is not diaphoretic.      ASSESSMENT/ PLAN:  1. UTI: will begin macrobid 100 mg twice daily for 7 days and will monitor  2. Back pain: will begin lidoderm patch daily to her lower back. Her bladder infection is more than likely contributing to her back pain at this time.

## 2014-01-13 ENCOUNTER — Encounter: Payer: Self-pay | Admitting: Adult Health

## 2014-01-13 DIAGNOSIS — J189 Pneumonia, unspecified organism: Secondary | ICD-10-CM

## 2014-01-13 DIAGNOSIS — R509 Fever, unspecified: Secondary | ICD-10-CM | POA: Insufficient documentation

## 2014-01-13 HISTORY — DX: Pneumonia, unspecified organism: J18.9

## 2014-01-13 LAB — CULTURE, BLOOD (SINGLE)

## 2014-01-13 NOTE — Progress Notes (Signed)
Patient ID: Kristen [name], female   DOB: Aug 10, 1935, 79 y.o.   MRN: [id number]     [facility]   Allergies  Allergen Reactions  . Erythromycin Base     Per MAR  . Penicillins     Per MAR  . Sulfa Antibiotics     Per MAR   . Chief Complaint  Patient presents with  . Medical Managment of Chronic Issues    HPI:  She is being seen for the management of her chronic illnesses. Staff reports that last night her 02 sats were low in the 80's and use of 02. She is running a fever this morning and is unable to participate in the hpi or ros.    Past Medical History  Diagnosis Date  . RA (rheumatoid arthritis) 05/15/2013  . HTN (hypertension) 05/15/2013  . GERD (gastroesophageal reflux disease) 05/15/2013  . COPD (chronic obstructive pulmonary disease) 05/15/2013  . Unspecified constipation 05/15/2013  . UTI (urinary tract infection) 05/15/2013    No past surgical history on file.  VITAL SIGNS BP 92/48  Pulse 68  Temp(Src) 101 F (38.3 C)  Ht 5\' 5"  (1.651 m)  Wt 109 lb 3.2 oz (49.533 kg)  BMI 18.17 kg/m2   Patient's Medications  New Prescriptions   No medications on file  Previous Medications   ACETAMINOPHEN (TYLENOL) 500 MG TABLET    Take 500 mg by mouth 2 (two) times daily. 0900 & 1700   CITALOPRAM (CELEXA) 20 MG TABLET    Take 20 mg by mouth every morning.    CYANOCOBALAMIN (VITAMIN B-12 IJ)    Inject 1,000 mg as directed every 30 (thirty) days. 15th of each month   FOLIC ACID (FOLVITE) 1 MG TABLET    Take 1 mg by mouth 2 (two) times daily. 0800 & 1700   LIDOCAINE (LIDODERM) 5 %    Place 1 patch onto the skin daily. Remove & Discard patch within 12 hours to her lower back   LORAZEPAM (ATIVAN) 2 MG/ML INJECTION    Administer 0.7ml intramuscularly 30 minutes prior to dental appointment   METHOTREXATE (RHEUMATREX) 2.5 MG TABLET    Take 12.5 mg by mouth once a week. mondays   METOCLOPRAMIDE (REGLAN) 5 MG TABLET    Take 2.5 mg by mouth 4 (four) times daily.    PANTOPRAZOLE  (PROTONIX) 40 MG TABLET    Take 40 mg by mouth daily. 0630 & 1630   POLYETHYLENE GLYCOL (MIRALAX / GLYCOLAX) PACKET    Take 17 g by mouth every morning.    SENNOSIDES-DOCUSATE SODIUM (SENOKOT-S) 8.6-50 MG TABLET    Take 2 tablets by mouth at bedtime.   Modified Medications   No medications on file  Discontinued Medications   No medications on file    SIGNIFICANT DIAGNOSTIC EXAMS  05-22-13: Left knee x-ray: moderate to severe osteopenia; mild osteoarthritis no acute fracture malalignment.sall joint effusion  01-08-14: chest x-ray: 1. Borderline heart size with elevated pulmonary vasculature suggest mild chf. 2. Very fine interstitial changes both lungs consistent with pneumonia or interstitial pulmonary edema.     LABS REVIEWED;   05-15-13: urine culture: e-coli: macrobid 05-16-13: wbc 6.3; hgb 11.7; hct 352. ;mcv 85.4 ;plt 356; glucose 93; bun 11; creat 0.63; k+3.7 Na++137 07-20-13: glucose 88; bun 7; creat 0.6; k+3.6; na++ 137; vit b12: 1383      Review of Systems  Unable to perform ROS   Physical Exam  Constitutional: No distress.  frail  Neck: Neck supple. No JVD present.  Cardiovascular: Normal rate, regular rhythm and intact distal pulses.   Respiratory: Effort normal. No respiratory distress.  Breath sounds diminished   GI: Soft. Bowel sounds are normal. She exhibits no distension. There is no tenderness.  Musculoskeletal: She exhibits no edema.  Is able to move all extremities.   Neurological: She is alert.  Skin: Skin is warm and dry. She is not diaphoretic.     ASSESSMENT/ PLAN:  1. For her fever: will get a cbc; cmp; u/a c&s; blood cultures will monitor her status  2. Pneumonia: will begin levaquin 500 mg daily with florastor twice daily for 10 days and will monitor her status   3. Hypertension: her blood pressure have been running low: will stop the norvasc and will continue to monitor her status.   4. [name]: will lower her protonix to 40 mg daily; will  continue reglan 2.5 mg four times daily  and will monitor  5. RA: no change in status: will continue her methotrexate 12.5 mg weekly with folic acid daily and will continue tylenol 500 mg twice daily and will monitor her status   6. Constipation: will continue miralax daily and will will continue senna s nightly and will monitor   7. Depression: she is presently stable will continue celexa 20 mg daily and will monitor her status.   Time spent with patient 45 minutes.

## 2014-01-28 ENCOUNTER — Encounter: Payer: Self-pay | Admitting: Adult Health

## 2014-01-28 ENCOUNTER — Non-Acute Institutional Stay (SKILLED_NURSING_FACILITY): Payer: Medicare Other | Admitting: Adult Health

## 2014-01-28 DIAGNOSIS — M549 Dorsalgia, unspecified: Secondary | ICD-10-CM

## 2014-01-28 DIAGNOSIS — R197 Diarrhea, unspecified: Secondary | ICD-10-CM

## 2014-01-28 MED ORDER — SENNA-DOCUSATE SODIUM 8.6-50 MG PO TABS: 2.0000 | ORAL_TABLET | Freq: Every day | ORAL | Status: DC | PRN

## 2014-01-28 MED ORDER — NAPROXEN SODIUM 220 MG PO TABS: 440.0000 mg | ORAL_TABLET | Freq: Two times a day (BID) | ORAL | Status: DC

## 2014-01-28 MED ORDER — POLYETHYLENE GLYCOL 3350 17 G PO PACK: 17.0000 g | PACK | Freq: Every day | ORAL | Status: DC | PRN

## 2014-01-28 NOTE — Progress Notes (Signed)
Patient ID: Kristen [name], female   DOB: [birth year], 79 y.o.   MRN: [id number]     [facility]  Allergies  Allergen Reactions  . Erythromycin Base     Per MAR  . Penicillins     Per MAR  . Sulfa Antibiotics     Per [facility]     Chief Complaint  Patient presents with  . Acute Visit    back pain loose stools     HPI:  Her family is concerned about her back pain and loose stools. Since she has completed her abt her stools have been loose. She is unable to tell me how many stools she is having per day. She is having back pain as well; which is not being managed with her current regimen. Her family is also concerned that she is spending nearly all of her time in the bed.  The staff tells me that she will decline to get out of bed and will cuss at the staff at times.  Nursing staff reports that she is weaker and is more confused since being treated for pneumonia ans uti.    Past Medical History  Diagnosis Date  . RA (rheumatoid arthritis) 05/15/2013  . HTN (hypertension) 05/15/2013  . GERD (gastroesophageal reflux disease) 05/15/2013  . COPD (chronic obstructive pulmonary disease) 05/15/2013  . Unspecified constipation 05/15/2013  . UTI (urinary tract infection) 05/15/2013    No past surgical history on file.  VITAL SIGNS BP 102/70  Pulse 74  Ht 5\' 5"  (1.651 m)  Wt 108 lb 4.8 oz (49.125 kg)  BMI 18.02 kg/m2   Patient's Medications  New Prescriptions   No medications on file  Previous Medications   ACETAMINOPHEN (TYLENOL) 500 MG TABLET    Take 500 mg by mouth 2 (two) times daily. 0900 & 1700   CITALOPRAM (CELEXA) 20 MG TABLET    Take 20 mg by mouth every morning.    CYANOCOBALAMIN (VITAMIN B-12 IJ)    Inject 1,000 mg as directed every 30 (thirty) days. 15th of each month   FOLIC ACID (FOLVITE) 1 MG TABLET    Take 1 mg by mouth 2 (two) times daily. 0800 & 1700   LIDOCAINE (LIDODERM) 5 %    Place 1 patch onto the skin daily. Remove & Discard patch within 12 hours to her lower  back   LORAZEPAM (ATIVAN) 2 MG/ML INJECTION    Administer 0.44ml intramuscularly 30 minutes prior to dental appointment   METHOTREXATE (RHEUMATREX) 2.5 MG TABLET    Take 12.5 mg by mouth once a week. mondays   METOCLOPRAMIDE (REGLAN) 5 MG TABLET    Take 2.5 mg by mouth 4 (four) times daily.    PANTOPRAZOLE (PROTONIX) 40 MG TABLET    Take 40 mg by mouth daily. 0630 & 1630   POLYETHYLENE GLYCOL (MIRALAX / GLYCOLAX) PACKET    Take 17 g by mouth every morning.    SENNOSIDES-DOCUSATE SODIUM (SENOKOT-S) 8.6-50 MG TABLET    Take 2 tablets by mouth at bedtime.   Modified Medications   No medications on file  Discontinued Medications   No medications on file    SIGNIFICANT DIAGNOSTIC EXAMS  05-22-13: Left knee x-ray: moderate to severe osteopenia; mild osteoarthritis no acute fracture malalignment.sall joint effusion  01-08-14: chest x-ray: borderline heart size with slightly elevated pulmonary vasculature suggest mild chf. Very fine interstitial changes both lungs consistent with either pneumonia or interstitial pulmonary edema.     LABS REVIEWED;   05-15-13: urine culture:  e-coli: macrobid 05-16-13: wbc 6.3; hgb 11.7; hct 352. ;mcv 85.4 ;plt 356; glucose 93; bun 11; creat 0.63; k+3.7 Na++137 07-20-13: glucose 88; bun 7; creat 0.6; k+3.6; na++ 137; vit b12: 1383 01-08-14; wbc 17.5; hgb 13.2; hct 40.0; mcv 92 plt 240; glucose 130; bun 8; creat 1.08; k+4.1; na++137 liver normal albumin 3.4; u/a: + nitrate  Blood culture: no growth Urine culture: e-coli:  macorbid 01-21-14: glucose 82; bun 13; creat 0.7; k+3.7; na++ 135 vit b12: 1151.0      Review of Systems  Constitutional: Negative.   Respiratory: Negative for cough.   Cardiovascular: Negative for chest pain.  Musculoskeletal: Positive for back pain and myalgias.  Skin: Negative.     Physical Exam  Constitutional: No distress.  frail  Neck: Neck supple. No JVD present.  Cardiovascular: Normal rate, regular rhythm and intact distal pulses.    Respiratory: Effort normal. No respiratory distress.  Slightly diminished breath sounds  GI: Soft. Bowel sounds are normal. She exhibits no distension. There is no tenderness.  Musculoskeletal: Normal range of motion.   Has bilateral tenderness to lower back to palpation. Is able to get self upright in bed without difficulty.  Uses wheelchair  Neurological: She is alert.  Skin: Skin is dry. She is not diaphoretic.     ASSESSMENT/ PLAN:  1. Back pain:  Will begin aleve 2 tabs twice daily for back pain; will have therapy evaluate and treat her back pain and will continue to monitor her status.   2. Diarrhea: will change her miralax and senna s to daily as needed at this time; will have nursing monitor her for signs of constipation and will treat as indicated.

## 2014-02-13 ENCOUNTER — Non-Acute Institutional Stay (SKILLED_NURSING_FACILITY): Payer: Medicare Other | Admitting: Adult Health

## 2014-02-13 DIAGNOSIS — J449 Chronic obstructive pulmonary disease, unspecified: Secondary | ICD-10-CM

## 2014-02-13 DIAGNOSIS — K5900 Constipation, unspecified: Secondary | ICD-10-CM

## 2014-02-13 DIAGNOSIS — K219 Gastro-esophageal reflux disease without esophagitis: Secondary | ICD-10-CM

## 2014-02-13 DIAGNOSIS — E538 Deficiency of other specified B group vitamins: Secondary | ICD-10-CM

## 2014-02-13 DIAGNOSIS — M549 Dorsalgia, unspecified: Secondary | ICD-10-CM

## 2014-02-13 DIAGNOSIS — F3289 Other specified depressive episodes: Secondary | ICD-10-CM

## 2014-02-13 DIAGNOSIS — M069 Rheumatoid arthritis, unspecified: Secondary | ICD-10-CM

## 2014-02-13 DIAGNOSIS — F329 Major depressive disorder, single episode, unspecified: Secondary | ICD-10-CM

## 2014-02-18 ENCOUNTER — Encounter: Payer: Self-pay | Admitting: Adult Health

## 2014-02-18 MED ORDER — METOCLOPRAMIDE HCL 5 MG PO TABS: 2.5000 mg | ORAL_TABLET | Freq: Three times a day (TID) | ORAL | Status: DC

## 2014-02-18 NOTE — Progress Notes (Signed)
Patient ID: Kristen [name], female   DOB: May 31, 1935, 79 y.o.   MRN: [id number]     [facility]  Allergies  Allergen Reactions  . Erythromycin Base     Per MAR  . Penicillins     Per MAR  . Sulfa Antibiotics     Per [facility]     Chief Complaint  Patient presents with  . Annual Exam    HPI:  She is being seen for her annual exam. Overall there has been little change in her status over the past month. She will need a screening mammogram. She does not want a bone density at this time and does not want a colonoscopy. There are no concerns being voiced by the nursing staff at this time. She states that her back pain is being managed at this time.    Past Medical History  Diagnosis Date  . RA (rheumatoid arthritis) 05/15/2013  . HTN (hypertension) 05/15/2013  . GERD (gastroesophageal reflux disease) 05/15/2013  . COPD (chronic obstructive pulmonary disease) 05/15/2013  . Unspecified constipation 05/15/2013  . UTI (urinary tract infection) 05/15/2013    No past surgical history on file.  VITAL SIGNS BP 102/70  Pulse 74  Ht 5\' 5"  (1.651 m)  Wt 108 lb 4.8 oz (49.125 kg)  BMI 18.02 kg/m2   Patient's Medications  New Prescriptions   No medications on file  Previous Medications   ACETAMINOPHEN (TYLENOL) 500 MG TABLET    Take 500 mg by mouth 2 (two) times daily. 0900 & 1700   CITALOPRAM (CELEXA) 20 MG TABLET    Take 20 mg by mouth every morning.    CYANOCOBALAMIN (VITAMIN B-12 IJ)    Inject 1,000 mg as directed every 30 (thirty) days. 15th of each month   FOLIC ACID (FOLVITE) 1 MG TABLET    Take 1 mg by mouth 2 (two) times daily. 0800 & 1700   LIDOCAINE (LIDODERM) 5 %    Place 1 patch onto the skin daily. Remove & Discard patch within 12 hours to her lower back   LORAZEPAM (ATIVAN) 2 MG/ML INJECTION    Administer 0.34ml intramuscularly 30 minutes prior to dental appointment   METHOTREXATE (RHEUMATREX) 2.5 MG TABLET    Take 12.5 mg by mouth once a week. mondays   METOCLOPRAMIDE  (REGLAN) 5 MG TABLET    Take 2.5 mg by mouth 4 (four) times daily.    NAPROXEN SODIUM (ALEVE) 220 MG TABLET    Take 2 tablets (440 mg total) by mouth 2 (two) times daily with a meal.   PANTOPRAZOLE (PROTONIX) 40 MG TABLET    Take 40 mg by mouth daily.   POLYETHYLENE GLYCOL (MIRALAX / GLYCOLAX) PACKET    Take 17 g by mouth daily as needed.   SENNOSIDES-DOCUSATE SODIUM (SENOKOT-S) 8.6-50 MG TABLET    Take 2 tablets by mouth daily as needed for constipation.  Modified Medications   No medications on file  Discontinued Medications   No medications on file    SIGNIFICANT DIAGNOSTIC EXAMS  05-22-13: Left knee x-ray: moderate to severe osteopenia; mild osteoarthritis no acute fracture malalignment.sall joint effusion  01-08-14: chest x-ray: borderline heart size with slightly elevated pulmonary vasculature suggest mild chf. Very fine interstitial changes both lungs consistent with either pneumonia or interstitial pulmonary edema.     LABS REVIEWED;   05-15-13: urine culture: e-coli: macrobid 05-16-13: wbc 6.3; hgb 11.7; hct 352. ;mcv 85.4 ;plt 356; glucose 93; bun 11; creat 0.63; k+3.7 Na++137 07-20-13: glucose 88; bun  7; creat 0.6; k+3.6; na++ 137; vit b12: 1383 01-08-14; wbc 17.5; hgb 13.2; hct 40.0; mcv 92 plt 240; glucose 130; bun 8; creat 1.08; k+4.1; na++137 liver normal albumin 3.4; u/a: + nitrate  Blood culture: no growth Urine culture: e-coli:  macorbid 01-21-14: glucose 82; bun 13; creat 0.7; k+3.7; na++ 135 vit b12: 1151.0      Review of Systems  Constitutional: Negative.   Respiratory: Negative for cough.   Cardiovascular: Negative for chest pain.  Musculoskeletal: negative for back pain and joint pain.  Skin: Negative.     Physical Exam  Constitutional: No distress.  frail  Neck: Neck supple. No JVD present.  Breast: breast exam negative  Cardiovascular: Normal rate, regular rhythm and intact distal pulses.   Respiratory: Effort normal. No respiratory distress.  Slightly  diminished breath sounds  GI: Soft. Bowel sounds are normal. She exhibits no distension. There is no tenderness.  Musculoskeletal: Normal range of motion. Uses wheelchair  Neurological: She is alert.  Skin: Skin is dry. She is not diaphoretic.      ASSESSMENT/ PLAN:  1. RA: no change in her status: will continue her methotrexate 12.5 mg weekly an folic acid 1 mg twice daily and will monitor her status   2. Back pain: her pain is being managed at this time; will continue aleve 2 tabs twice daily and tylenol 500 mg twice daily; lidoderm to her lower back and will monitor  3. [name]: is stable will continue protonix 40 mg daily will lower her reglan to 2.5 mg three times daily and will monitor her status.   4. Depression: she is stable; will continue her celexa 20 mg daily and will monitor   5. Constipation; she is having fewer stools since changing her miralax and senna s to daily as needed will not make changes.   6. Vit b12 deficiency: will continue monthly injections   7. Copd: is stable is presently not on medications; will not make changes will monitor her status.   Will setup a screening mammogram ;will guaiac stools X3; will stop her 02        Synthia Innocent NP [facility]  Contact [phone] Monday through Friday 8am- 5pm  After hours call [phone]

## 2014-02-19 ENCOUNTER — Other Ambulatory Visit: Payer: Self-pay | Admitting: Internal Medicine

## 2014-02-19 ENCOUNTER — Other Ambulatory Visit: Payer: Self-pay

## 2014-02-19 DIAGNOSIS — Z1231 Encounter for screening mammogram for malignant neoplasm of breast: Secondary | ICD-10-CM

## 2014-03-06 ENCOUNTER — Inpatient Hospital Stay: Admission: RE | Admit: 2014-03-06 | Payer: Medicare Other | Source: Ambulatory Visit

## 2014-03-11 ENCOUNTER — Non-Acute Institutional Stay (SKILLED_NURSING_FACILITY): Payer: Medicare Other | Admitting: Internal Medicine

## 2014-03-11 ENCOUNTER — Encounter: Payer: Self-pay | Admitting: Internal Medicine

## 2014-03-11 DIAGNOSIS — E538 Deficiency of other specified B group vitamins: Secondary | ICD-10-CM

## 2014-03-11 DIAGNOSIS — K219 Gastro-esophageal reflux disease without esophagitis: Secondary | ICD-10-CM

## 2014-03-11 DIAGNOSIS — F3289 Other specified depressive episodes: Secondary | ICD-10-CM

## 2014-03-11 DIAGNOSIS — F329 Major depressive disorder, single episode, unspecified: Secondary | ICD-10-CM

## 2014-03-11 DIAGNOSIS — M069 Rheumatoid arthritis, unspecified: Secondary | ICD-10-CM

## 2014-03-11 DIAGNOSIS — M549 Dorsalgia, unspecified: Secondary | ICD-10-CM

## 2014-03-11 NOTE — Progress Notes (Signed)
Patient ID: Kristen [name], female   DOB: [birth year], 79 y.o.   MRN: [id number]    [facility]  Chief Complaint  Patient presents with  . Medical Managment of Chronic Issues   Allergies  Allergen Reactions  . Erythromycin Base     Per MAR  . Penicillins     Per MAR  . Sulfa Antibiotics     Per MAR   Code- full code  HPI 79 y/o female pt is a long term care resident with history of RA, HTN, GERD among others. She is seen today for routine visit. As per staff, her po intake is poor. Review her weight over 3 months. She has lost 2 lbs and weighs 108 lb at present. She is mostly in bed and sleeping as per staff. No other concerns. Pt denies any complaints.  Review of Systems  Constitutional: Negative for fever, chills, diaphoresis.  HENT: Negative for congestion, hearing loss and sore throat.   Eyes: Negative for blurred vision, double vision and discharge.  Respiratory: Negative for cough, sputum production, shortness of breath and wheezing.   Cardiovascular: Negative for chest pain, palpitations, orthopnea and leg swelling.  Gastrointestinal: Negative for heartburn, nausea, vomiting, abdominal pain, diarrhea and constipation.  Genitourinary: Negative for dysuria Musculoskeletal: Negative for falls, joint pain and myalgias. her back pain is under control Skin: Negative for itching and rash.  Neurological: Negative for dizziness, tingling, focal weakness and headaches.  Psychiatric/Behavioral: The patient is not nervous/anxious.    Past Medical History  Diagnosis Date  . RA (rheumatoid arthritis) 05/15/2013  . HTN (hypertension) 05/15/2013  . GERD (gastroesophageal reflux disease) 05/15/2013  . COPD (chronic obstructive pulmonary disease) 05/15/2013  . Unspecified constipation 05/15/2013  . UTI (urinary tract infection) 05/15/2013   History reviewed. No pertinent past surgical history.  Current Outpatient Prescriptions on File Prior to Visit  Medication Sig Dispense  Refill  . acetaminophen (TYLENOL) 500 MG tablet Take 500 mg by mouth 2 (two) times daily. 0900 & 1700      . citalopram (CELEXA) 20 MG tablet Take 20 mg by mouth every morning.       . Cyanocobalamin (VITAMIN B-12 IJ) Inject 1,000 mg as directed every 30 (thirty) days. 15th of each month      . folic acid (FOLVITE) 1 MG tablet Take 1 mg by mouth 2 (two) times daily. 0800 & 1700      . lidocaine (LIDODERM) 5 % Place 1 patch onto the skin daily. Remove & Discard patch within 12 hours to her lower back  30 patch  0  . LORazepam (ATIVAN) 2 MG/ML injection Administer 0.55ml intramuscularly 30 minutes prior to dental appointment  1 mL  0  . methotrexate (RHEUMATREX) 2.5 MG tablet Take 12.5 mg by mouth once a week. mondays      . metoCLOPramide (REGLAN) 5 MG tablet Take 0.5 tablets (2.5 mg total) by mouth 3 (three) times daily before meals.  90 tablet  11  . naproxen sodium (ALEVE) 220 MG tablet Take 2 tablets (440 mg total) by mouth 2 (two) times daily with a meal.  60 tablet  11  . pantoprazole (PROTONIX) 40 MG tablet Take 40 mg by mouth daily. 0630 & 1630      . polyethylene glycol (MIRALAX / GLYCOLAX) packet Take 17 g by mouth daily as needed.  14 each  11  . sennosides-docusate sodium (SENOKOT-S) 8.6-50 MG tablet Take 2 tablets by mouth daily as needed for constipation.  60 tablet  11   No current facility-administered medications on file prior to visit.    Physical exam BP 118/70  Pulse 64  Temp(Src) 97.4 F (36.3 C)  Resp 18  Wt 108 lb (48.988 kg)  SpO2 94%  Constitutional: No distress. frail  Neck: Neck supple. No JVD present.  Breast: breast exam negative   Cardiovascular: Normal rate, regular rhythm and intact distal pulses.   Respiratory: Effort normal. No respiratory distress. No wheeze, rhonchi or crackles GI: Soft. Bowel sounds are normal. She exhibits no distension. There is no tenderness.  Musculoskeletal: Normal range of motion. Uses wheelchair  Neurological: She is alert.    Skin: Skin is dry. She is not diaphoretic.   Labs- 05-15-13: urine culture: e-coli: macrobid 05-16-13: wbc 6.3; hgb 11.7; hct 352. ;mcv 85.4 ;plt 356; glucose 93; bun 11; creat 0.63; k+3.7 Na++137 07-20-13: glucose 88; bun 7; creat 0.6; k+3.6; na++ 137; vit b12: 1383 01-08-14; wbc 17.5; hgb 13.2; hct 40.0; mcv 92 plt 240; glucose 130; bun 8; creat 1.08; k+4.1; na++137 liver normal albumin 3.4; u/a: + nitrate   Blood culture: no growth Urine culture: e-coli:  macorbid 01-21-14: glucose 82; bun 13; creat 0.7; k+3.7; na++ 135 vit b12: 1151.0   Assessment/plan  Depression- worsened. Will increase her celexa to 40 mg daily for now and reassess. If no improvement, consider psych consult  Vitamin b12 def- has normal b12 level. Will discontinue her b12 injection for now. Recheck b12 level in 3 months  RA- stable. continue methotrexate 12.5 mg once a week and folic acid 1 mg twice daily. Check cbc   Constipation- stable. Continue miralax and senna-s  GERD- stable. Continue protonix for now with reglan. Monitor clinically  Back pain- stable on aleve and tylenol with lidoderm for now. Given her age, will monitor renal function and h/h with alevel. Check cbc  Labs- cbc next lab, b12 in 3 months  [name], MD  [facility] [phone] (Monday-Friday 8 am - 5 pm) [phone] (afterhours)

## 2014-03-13 ENCOUNTER — Ambulatory Visit: Payer: Medicare Other

## 2014-03-20 ENCOUNTER — Emergency Department: Payer: Self-pay | Admitting: Emergency Medicine

## 2014-03-20 LAB — CBC WITH DIFFERENTIAL/PLATELET
Basophil #: 0 10*3/uL (ref 0.0–0.1)
Basophil %: 0.4 %
Eosinophil #: 0.6 10*3/uL (ref 0.0–0.7)
Eosinophil %: 7.6 %
HCT: 38.3 % (ref 35.0–47.0)
HGB: 12.4 g/dL (ref 12.0–16.0)
Lymphocyte #: 0.7 10*3/uL — ABNORMAL LOW (ref 1.0–3.6)
Lymphocyte %: 9.3 %
MCH: 30.3 pg (ref 26.0–34.0)
MCHC: 32.5 g/dL (ref 32.0–36.0)
MCV: 93 fL (ref 80–100)
MONO ABS: 0.4 x10 3/mm (ref 0.2–0.9)
MONOS PCT: 4.6 %
NEUTROS ABS: 6.2 10*3/uL (ref 1.4–6.5)
NEUTROS PCT: 78.1 %
PLATELETS: 263 10*3/uL (ref 150–440)
RBC: 4.11 10*6/uL (ref 3.80–5.20)
RDW: 14.9 % — ABNORMAL HIGH (ref 11.5–14.5)
WBC: 8 10*3/uL (ref 3.6–11.0)

## 2014-03-20 LAB — COMPREHENSIVE METABOLIC PANEL
ALK PHOS: 138 U/L — AB
AST: 21 U/L (ref 15–37)
Albumin: 3.2 g/dL — ABNORMAL LOW (ref 3.4–5.0)
Anion Gap: 7 (ref 7–16)
BUN: 12 mg/dL (ref 7–18)
Bilirubin,Total: 0.3 mg/dL (ref 0.2–1.0)
CO2: 28 mmol/L (ref 21–32)
Calcium, Total: 8.4 mg/dL — ABNORMAL LOW (ref 8.5–10.1)
Chloride: 101 mmol/L (ref 98–107)
Creatinine: 0.85 mg/dL (ref 0.60–1.30)
EGFR (Non-African Amer.): >60
Glucose: 121 mg/dL — ABNORMAL HIGH (ref 65–99)
OSMOLALITY: 273 (ref 275–301)
Potassium: 3.5 mmol/L (ref 3.5–5.1)
SGPT (ALT): 16 U/L (ref 12–78)
SODIUM: 136 mmol/L (ref 136–145)
TOTAL PROTEIN: 7 g/dL (ref 6.4–8.2)

## 2014-03-20 LAB — URINALYSIS, COMPLETE
Bilirubin,UR: NEGATIVE
GLUCOSE, UR: NEGATIVE mg/dL (ref 0–75)
Hyaline Cast: 2
Ketone: NEGATIVE
Nitrite: POSITIVE
Ph: 5 (ref 4.5–8.0)
Protein: 30
RBC,UR: 37 /HPF (ref 0–5)
Specific Gravity: 1.014 (ref 1.003–1.030)
Squamous Epithelial: NONE SEEN

## 2014-03-20 LAB — TROPONIN I: Troponin-I: <0.02 ng/mL

## 2014-03-22 LAB — URINE CULTURE

## 2014-04-10 ENCOUNTER — Encounter: Payer: Self-pay | Admitting: Adult Health

## 2014-04-10 ENCOUNTER — Non-Acute Institutional Stay (SKILLED_NURSING_FACILITY): Payer: Medicare Other | Admitting: Adult Health

## 2014-04-10 DIAGNOSIS — N390 Urinary tract infection, site not specified: Secondary | ICD-10-CM

## 2014-04-10 DIAGNOSIS — M549 Dorsalgia, unspecified: Secondary | ICD-10-CM

## 2014-04-10 DIAGNOSIS — F3289 Other specified depressive episodes: Secondary | ICD-10-CM

## 2014-04-10 DIAGNOSIS — M069 Rheumatoid arthritis, unspecified: Secondary | ICD-10-CM

## 2014-04-10 DIAGNOSIS — K5900 Constipation, unspecified: Secondary | ICD-10-CM

## 2014-04-10 DIAGNOSIS — J449 Chronic obstructive pulmonary disease, unspecified: Secondary | ICD-10-CM

## 2014-04-10 DIAGNOSIS — K219 Gastro-esophageal reflux disease without esophagitis: Secondary | ICD-10-CM

## 2014-04-10 DIAGNOSIS — F329 Major depressive disorder, single episode, unspecified: Secondary | ICD-10-CM

## 2014-04-10 NOTE — Progress Notes (Signed)
Patient ID: Kristen [name], female   DOB: September 13, 1935, 79 y.o.   MRN: [id number]     [facility]  Allergies  Allergen Reactions  . Erythromycin Base     Per MAR  . Penicillins     Per [facility]     Chief Complaint  Patient presents with  . Medical Management of Chronic Issues    HPI:  She is being seen for the management of her chronic illnesses. She has bene treated for an uti in the past couple of weeks.  She continues to spend a majority of her time in her room and in bed. There is concern about depression. She will need a nceps referral for further evaluation.    Past Medical History  Diagnosis Date  . RA (rheumatoid arthritis) 05/15/2013  . HTN (hypertension) 05/15/2013  . GERD (gastroesophageal reflux disease) 05/15/2013  . COPD (chronic obstructive pulmonary disease) 05/15/2013  . Unspecified constipation 05/15/2013  . UTI (urinary tract infection) 05/15/2013    No past surgical history on file.  VITAL SIGNS BP 100/77  Pulse 74  Ht 5\' 5"  (1.651 m)  Wt 107 lb 6.4 oz (48.716 kg)  BMI 17.87 kg/m2   Patient's Medications  New Prescriptions   No medications on file  Previous Medications   ACETAMINOPHEN (TYLENOL) 500 MG TABLET    Take 500 mg by mouth 2 (two) times daily. 0900 & 1700   CITALOPRAM (CELEXA) 20 MG TABLET    Take 20 mg by mouth every morning.    FOLIC ACID (FOLVITE) 1 MG TABLET    Take 1 mg by mouth 2 (two) times daily. 0800 & 1700   LIDOCAINE (LIDODERM) 5 %    Place 1 patch onto the skin daily. Remove & Discard patch within 12 hours to her lower back   METHOTREXATE (RHEUMATREX) 2.5 MG TABLET    Take 12.5 mg by mouth once a week. mondays   METOCLOPRAMIDE (REGLAN) 5 MG TABLET    Take 0.5 tablets (2.5 mg total) by mouth 3 (three) times daily before meals.   NAPROXEN SODIUM (ALEVE) 220 MG TABLET    Take 2 tablets (440 mg total) by mouth 2 (two) times daily with a meal.   PANTOPRAZOLE (PROTONIX) 40 MG TABLET    Take 40 mg by mouth daily. 0630 & 1630   POLYETHYLENE  GLYCOL (MIRALAX / GLYCOLAX) PACKET    Take 17 g by mouth daily as needed.   SENNOSIDES-DOCUSATE SODIUM (SENOKOT-S) 8.6-50 MG TABLET    Take 2 tablets by mouth daily as needed for constipation.  Modified Medications   No medications on file  Discontinued Medications   CYANOCOBALAMIN (VITAMIN B-12 IJ)    Inject 1,000 mg as directed every 30 (thirty) days. 15th of each month   LORAZEPAM (ATIVAN) 2 MG/ML INJECTION    Administer 0.38ml intramuscularly 30 minutes prior to dental appointment    SIGNIFICANT DIAGNOSTIC EXAMS  05-22-13: Left knee x-ray: moderate to severe osteopenia; mild osteoarthritis no acute fracture malalignment.sall joint effusion  01-08-14: chest x-ray: borderline heart size with slightly elevated pulmonary vasculature suggest mild chf. Very fine interstitial changes both lungs consistent with either pneumonia or interstitial pulmonary edema.     LABS REVIEWED;   05-15-13: urine culture: e-coli: macrobid 05-16-13: wbc 6.3; hgb 11.7; hct 352. ;mcv 85.4 ;plt 356; glucose 93; bun 11; creat 0.63; k+3.7 Na++137 07-20-13: glucose 88; bun 7; creat 0.6; k+3.6; na++ 137; vit b12: 1383 01-08-14; wbc 17.5; hgb 13.2; hct 40.0; mcv 92 plt 240;  glucose 130; bun 8; creat 1.08; k+4.1; na++137 liver normal albumin 3.4; u/a: + nitrate  Blood culture: no growth Urine culture: e-coli:  macorbid 01-21-14: glucose 82; bun 13; creat 0.7; k+3.7; na++ 135 vit b12: 1151.0  03-20-14; urine culture: e-coli: septra ds      Review of Systems  Constitutional: Negative.   Respiratory: Negative for cough.   Cardiovascular: Negative for chest pain.  Musculoskeletal: negative for back pain and joint pain.  Skin: Negative.     Physical Exam  Constitutional: No distress.  frail  Neck: Neck supple. No JVD present.  Breast: breast exam negative  Cardiovascular: Normal rate, regular rhythm and intact distal pulses.   Respiratory: Effort normal. No respiratory distress.  Slightly diminished breath sounds    GI: Soft. Bowel sounds are normal. She exhibits no distension. There is no tenderness.  Musculoskeletal: Normal range of motion. Uses wheelchair  Neurological: She is alert.  Skin: Skin is dry. She is not diaphoretic.      ASSESSMENT/ PLAN:  1. RA: no change in her status: will continue her methotrexate 12.5 mg weekly an folic acid 1 mg twice daily and will monitor her status   2. Back pain: her pain is being managed at this time; will continue aleve 2 tabs twice daily and tylenol 500 mg twice daily; lidoderm to her lower back and will monitor  3. [name]: is stable will continue protonix 40 mg daily will continue  reglan  2.5 mg three times daily and will monitor her status.   4. Depression: she is worse; will continue her celexa 20 mg daily and will have nceps follow up.    5. Constipation; will continue miralax and senna s to daily as needed will not make changes.   6. Vit b12 deficiency:  Is presently off injections will monitor   7. Copd: is stable is presently not on medications; will not make changes will monitor her status.     Synthia Innocent NP [facility]  Contact [phone] Monday through Friday 8am- 5pm  After hours call [phone]

## 2014-05-09 ENCOUNTER — Non-Acute Institutional Stay (SKILLED_NURSING_FACILITY): Payer: Medicare Other | Admitting: Adult Health

## 2014-05-09 DIAGNOSIS — K219 Gastro-esophageal reflux disease without esophagitis: Secondary | ICD-10-CM

## 2014-05-09 DIAGNOSIS — M549 Dorsalgia, unspecified: Secondary | ICD-10-CM

## 2014-05-09 DIAGNOSIS — J449 Chronic obstructive pulmonary disease, unspecified: Secondary | ICD-10-CM

## 2014-05-09 DIAGNOSIS — M069 Rheumatoid arthritis, unspecified: Secondary | ICD-10-CM

## 2014-05-09 DIAGNOSIS — F3289 Other specified depressive episodes: Secondary | ICD-10-CM

## 2014-05-09 DIAGNOSIS — I10 Essential (primary) hypertension: Secondary | ICD-10-CM

## 2014-05-09 DIAGNOSIS — F329 Major depressive disorder, single episode, unspecified: Secondary | ICD-10-CM

## 2014-05-10 LAB — BASIC METABOLIC PANEL
BUN: 11 mg/dL (ref 4–21)
CREATININE: 0.7 mg/dL (ref 0.5–1.1)
GLUCOSE: 78 mg/dL
POTASSIUM: 4.1 mmol/L (ref 3.4–5.3)
SODIUM: 135 mmol/L — AB (ref 137–147)

## 2014-05-10 LAB — CBC AND DIFFERENTIAL
HEMATOCRIT: 35 % — AB (ref 36–46)
HEMOGLOBIN: 11.3 g/dL — AB (ref 12.0–16.0)
PLATELETS: 268 10*3/uL (ref 150–399)
WBC: 9.7 10*3/mL

## 2014-05-10 LAB — HEPATIC FUNCTION PANEL
ALK PHOS: 68 U/L (ref 25–125)
ALT: 14 U/L (ref 7–35)
AST: 21 U/L (ref 13–35)
Bilirubin, Total: 0.4 mg/dL

## 2014-05-19 ENCOUNTER — Encounter: Payer: Self-pay | Admitting: Adult Health

## 2014-05-19 NOTE — Progress Notes (Signed)
Patient ID: GERAL TUCH, female   DOB: [birth year], 79 y.o.   MRN: [id number]     [facility]  Allergies  Allergen Reactions  . Erythromycin Base     Per MAR  . Penicillins     Per [facility]     Chief Complaint  Patient presents with  . Medical Management of Chronic Issues    HPI:  She is being seen for the management of her chronic illnesses. She has been stated on vit d for low level. She is being seen by nceps for her depression and is now taking cymbalta for her depression. She does tend to spend most her time in her bed per her choice. The staff does try to encourage her to get out of bed on a frequent basis.    Past Medical History  Diagnosis Date  . RA (rheumatoid arthritis) 05/15/2013  . HTN (hypertension) 05/15/2013  . GERD (gastroesophageal reflux disease) 05/15/2013  . COPD (chronic obstructive pulmonary disease) 05/15/2013  . Unspecified constipation 05/15/2013  . UTI (urinary tract infection) 05/15/2013    No past surgical history on file.  VITAL SIGNS BP 100/55  Pulse 64  Ht 5\' 5"  (1.651 m)  Wt 103 lb 12.8 oz (47.083 kg)  BMI 17.27 kg/m2  SpO2 93%   Patient's Medications  New Prescriptions   No medications on file  Previous Medications   ACETAMINOPHEN (TYLENOL) 500 MG TABLET    Take 500 mg by mouth 2 (two) times daily. 0900 & 1700   DULOXETINE (CYMBALTA) 20 MG CAPSULE    Take 40 mg by mouth daily.   Vitamin d 50,000 units weekly    FOLIC ACID (FOLVITE) 1 MG TABLET    Take 1 mg by mouth 2 (two) times daily. 0800 & 1700   LIDOCAINE (LIDODERM) 5 %    Place 1 patch onto the skin daily. Remove & Discard patch within 12 hours to her lower back   METHOTREXATE (RHEUMATREX) 2.5 MG TABLET    Take 12.5 mg by mouth once a week. mondays   METOCLOPRAMIDE (REGLAN) 5 MG TABLET    Take 0.5 tablets (2.5 mg total) by mouth 3 (three) times daily before meals.   NAPROXEN SODIUM (ALEVE) 220 MG TABLET    Take 2 tablets (440 mg total) by mouth 2 (two) times daily with a meal.   PANTOPRAZOLE (PROTONIX) 40 MG TABLET    Take 40 mg by mouth daily. 0630 & 1630   POLYETHYLENE GLYCOL (MIRALAX / GLYCOLAX) PACKET    Take 17 g by mouth daily as needed.   SENNOSIDES-DOCUSATE SODIUM (SENOKOT-S) 8.6-50 MG TABLET    Take 2 tablets by mouth daily as needed for constipation.  Modified Medications   No medications on file  Discontinued Medications   CITALOPRAM (CELEXA) 20 MG TABLET    Take 20 mg by mouth every morning.     SIGNIFICANT DIAGNOSTIC EXAMS   05-22-13: Left knee x-ray: moderate to severe osteopenia; mild osteoarthritis no acute fracture malalignment.sall joint effusion  01-08-14: chest x-ray: borderline heart size with slightly elevated pulmonary vasculature suggest mild chf. Very fine interstitial changes both lungs consistent with either pneumonia or interstitial pulmonary edema.     LABS REVIEWED;   05-15-13: urine culture: e-coli: macrobid 05-16-13: wbc 6.3; hgb 11.7; hct 352. ;mcv 85.4 ;plt 356; glucose 93; bun 11; creat 0.63; k+3.7 Na++137 07-20-13: glucose 88; bun 7; creat 0.6; k+3.6; na++ 137; vit b12: 1383 01-08-14; wbc 17.5; hgb 13.2; hct 40.0; mcv 92 plt 240;  glucose 130; bun 8; creat 1.08; k+4.1; na++137 liver normal albumin 3.4; u/a: + nitrate  Blood culture: no growth Urine culture: e-coli:  macorbid 01-21-14: glucose 82; bun 13; creat 0.7; k+3.7; na++ 135 vit b12: 1151.0  03-20-14; urine culture: e-coli: septra ds  04-15-14: tsh 3.631; vit d 18.18      Review of Systems  Constitutional: Negative.   Respiratory: Negative for cough.   Cardiovascular: Negative for chest pain.  Musculoskeletal: negative for back pain and joint pain.  Skin: Negative.     Physical Exam  Constitutional: No distress.  frail  Neck: Neck supple. No JVD present.  Breast: breast exam negative  Cardiovascular: Normal rate, regular rhythm and intact distal pulses.   Respiratory: Effort normal. No respiratory distress.  Slightly diminished breath sounds  GI: Soft. Bowel  sounds are normal. She exhibits no distension. There is no tenderness.  Musculoskeletal: Normal range of motion. Uses wheelchair  Neurological: She is alert.  Skin: Skin is dry. She is not diaphoretic.      ASSESSMENT/ PLAN:  1. RA: no change in her status: will continue her methotrexate 12.5 mg weekly an folic acid 1 mg twice daily and will monitor her status   2. Back pain: her pain is being managed at this time; will continue aleve 2 tabs twice daily and tylenol 500 mg twice daily; lidoderm to her lower back and will monitor  3. [name]: is stable will continue protonix 40 mg daily will continue  reglan  2.5 mg three times daily and will monitor her status.   4. Depression: she is followed by nceps; will continue her cymbalta 40 mg daily will monitor.    5. Constipation; will continue miralax and senna s to daily as needed will not make changes.   6. Vit b12 deficiency:  Is presently off injections will monitor   7. Copd: is stable is presently not on medications; will not make changes will monitor her status.  Will check cbc; cmp next draw    Synthia Innocent NP [facility]  Contact [phone] Monday through Friday 8am- 5pm  After hours call [phone]

## 2014-05-25 ENCOUNTER — Encounter (HOSPITAL_COMMUNITY): Payer: Self-pay | Admitting: Emergency Medicine

## 2014-05-25 ENCOUNTER — Emergency Department (HOSPITAL_COMMUNITY)
Admission: EM | Admit: 2014-05-25 | Discharge: 2014-05-25 | Disposition: A | Discharge status: Home / Self Care | Payer: Medicare Other | Attending: Emergency Medicine | Admitting: Emergency Medicine

## 2014-05-25 DIAGNOSIS — Z8701 Personal history of pneumonia (recurrent): Secondary | ICD-10-CM | POA: Diagnosis not present

## 2014-05-25 DIAGNOSIS — M129 Arthropathy, unspecified: Secondary | ICD-10-CM | POA: Diagnosis not present

## 2014-05-25 DIAGNOSIS — Z79899 Other long term (current) drug therapy: Secondary | ICD-10-CM | POA: Diagnosis not present

## 2014-05-25 DIAGNOSIS — Z87891 Personal history of nicotine dependence: Secondary | ICD-10-CM | POA: Diagnosis not present

## 2014-05-25 DIAGNOSIS — I10 Essential (primary) hypertension: Secondary | ICD-10-CM | POA: Diagnosis not present

## 2014-05-25 DIAGNOSIS — R4182 Altered mental status, unspecified: Secondary | ICD-10-CM | POA: Insufficient documentation

## 2014-05-25 DIAGNOSIS — N390 Urinary tract infection, site not specified: Secondary | ICD-10-CM | POA: Insufficient documentation

## 2014-05-25 DIAGNOSIS — Z880 Allergy status to penicillin: Secondary | ICD-10-CM | POA: Insufficient documentation

## 2014-05-25 DIAGNOSIS — K5900 Constipation, unspecified: Secondary | ICD-10-CM | POA: Diagnosis not present

## 2014-05-25 DIAGNOSIS — J449 Chronic obstructive pulmonary disease, unspecified: Secondary | ICD-10-CM | POA: Insufficient documentation

## 2014-05-25 DIAGNOSIS — K219 Gastro-esophageal reflux disease without esophagitis: Secondary | ICD-10-CM | POA: Insufficient documentation

## 2014-05-25 DIAGNOSIS — J4489 Other specified chronic obstructive pulmonary disease: Secondary | ICD-10-CM | POA: Insufficient documentation

## 2014-05-25 DIAGNOSIS — Z791 Long term (current) use of non-steroidal anti-inflammatories (NSAID): Secondary | ICD-10-CM | POA: Insufficient documentation

## 2014-05-25 DIAGNOSIS — Z792 Long term (current) use of antibiotics: Secondary | ICD-10-CM | POA: Insufficient documentation

## 2014-05-25 LAB — CBC
HCT: 35.9 % — ABNORMAL LOW (ref 36.0–46.0)
Hemoglobin: 11.9 g/dL — ABNORMAL LOW (ref 12.0–15.0)
MCH: 30.7 pg (ref 26.0–34.0)
MCHC: 33.1 g/dL (ref 30.0–36.0)
MCV: 92.5 fL (ref 78.0–100.0)
PLATELETS: 325 10*3/uL (ref 150–400)
RBC: 3.88 MIL/uL (ref 3.87–5.11)
RDW: 13.8 % (ref 11.5–15.5)
WBC: 9.3 10*3/uL (ref 4.0–10.5)

## 2014-05-25 LAB — URINALYSIS, ROUTINE W REFLEX MICROSCOPIC
Bilirubin Urine: NEGATIVE
GLUCOSE, UA: NEGATIVE mg/dL
Ketones, ur: NEGATIVE mg/dL
Nitrite: NEGATIVE
PROTEIN: NEGATIVE mg/dL
SPECIFIC GRAVITY, URINE: 1.013 (ref 1.005–1.030)
Urobilinogen, UA: 0.2 mg/dL (ref 0.0–1.0)
pH: 7.5 (ref 5.0–8.0)

## 2014-05-25 LAB — COMPREHENSIVE METABOLIC PANEL
ALBUMIN: 3.2 g/dL — AB (ref 3.5–5.2)
ALT: 14 U/L (ref 0–35)
AST: 26 U/L (ref 0–37)
Alkaline Phosphatase: 73 U/L (ref 39–117)
BILIRUBIN TOTAL: 0.2 mg/dL — AB (ref 0.3–1.2)
BUN: 8 mg/dL (ref 6–23)
CHLORIDE: 97 meq/L (ref 96–112)
CO2: 26 mEq/L (ref 19–32)
CREATININE: 0.69 mg/dL (ref 0.50–1.10)
Calcium: 9.2 mg/dL (ref 8.4–10.5)
GFR calc Af Amer: >90 mL/min (ref >90)
GFR calc non Af Amer: 82 mL/min — ABNORMAL LOW (ref >90)
Glucose, Bld: 95 mg/dL (ref 70–99)
Potassium: 4.4 mEq/L (ref 3.7–5.3)
Sodium: 134 mEq/L — ABNORMAL LOW (ref 137–147)
Total Protein: 6.8 g/dL (ref 6.0–8.3)

## 2014-05-25 LAB — URINE MICROSCOPIC-ADD ON

## 2014-05-25 MED ORDER — CEPHALEXIN 500 MG PO CAPS: 500.0000 mg | ORAL_CAPSULE | Freq: Four times a day (QID) | ORAL | Status: DC

## 2014-05-25 MED ORDER — DEXTROSE 5 % IV SOLN
1.0000 g | INTRAVENOUS | Status: DC
  Administered 2014-05-25: 1 g via INTRAVENOUS
  Filled 2014-05-25: qty 10

## 2014-05-25 NOTE — ED Provider Notes (Signed)
CSN: [id number]     Arrival date & time 05/25/14  1358 History   First MD Initiated Contact with Patient 05/25/14 1409     Chief Complaint  Patient presents with  . Altered Mental Status     (Consider location/radiation/quality/duration/timing/severity/associated sxs/prior Treatment) Patient is a 79 y.o. female presenting with altered mental status. The history is provided by the patient, a relative and the EMS personnel.  Altered Mental Status Presenting symptoms: confusion   Associated symptoms: no abdominal pain, no fever, no headaches, no rash and no vomiting   pt with hx copd, dementia, utis, presents from ecf w confusion, altered mental status.  Hx same. Pt states she feels fine - denies pain or other specific c/o. No fever or chills. No chest pain. No sob. No cough or uri c/o. No abd pain. No nv. Pt unaware of any change in meds. Family member notes hx utis, requests pcp eval for possible prophylactic abx.  Upon family member arrival states pts mental status in ED appears c/w baseline.     Past Medical History  Diagnosis Date  . RA (rheumatoid arthritis) 05/15/2013  . HTN (hypertension) 05/15/2013  . GERD (gastroesophageal reflux disease) 05/15/2013  . COPD (chronic obstructive pulmonary disease) 05/15/2013  . Unspecified constipation 05/15/2013  . UTI (urinary tract infection) 05/15/2013  . HCAP (healthcare-associated pneumonia) 01/13/2014   History reviewed. No pertinent past surgical history. No family history on file. History  Substance Use Topics  . Smoking status: Former Games developer  . Smokeless tobacco: Not on file  . Alcohol Use: No   OB History   Grav Para Term Preterm Abortions TAB SAB Ect Mult Living                 Review of Systems  Constitutional: Negative for fever and chills.  HENT: Negative for sore throat.   Eyes: Negative for visual disturbance.  Respiratory: Negative for cough and shortness of breath.   Cardiovascular: Negative for chest pain.   Gastrointestinal: Negative for vomiting, abdominal pain and diarrhea.  Genitourinary: Negative for dysuria and flank pain.  Musculoskeletal: Negative for back pain and neck pain.  Skin: Negative for rash.  Neurological: Negative for headaches.  Hematological: Does not bruise/bleed easily.  Psychiatric/Behavioral: Positive for confusion.      Allergies  Erythromycin base and Penicillins  Home Medications   Prior to Admission medications   Medication Sig Start Date End Date Taking? Authorizing Provider  acetaminophen (TYLENOL) 500 MG tablet Take 500 mg by mouth 2 (two) times daily. 0900 & 1700   Yes [name], NP  citalopram (CELEXA) 20 MG tablet Take 20 mg by mouth daily.   Yes Historical Provider, MD  DULoxetine (CYMBALTA) 20 MG capsule Take 40 mg by mouth daily.   Yes Historical Provider, MD  folic acid (FOLVITE) 1 MG tablet Take 1 mg by mouth 2 (two) times daily. 0800 & 1700   Yes [name], NP  lidocaine (LIDODERM) 5 % Place 1 patch onto the skin daily. Remove & Discard patch within 12 hours to her lower back 01/11/14  Yes [name], NP  methotrexate (RHEUMATREX) 2.5 MG tablet Take 12.5 mg by mouth once a week. mondays   Yes [name], NP  metoCLOPramide (REGLAN) 5 MG tablet Take 0.5 tablets (2.5 mg total) by mouth 3 (three) times daily before meals. 02/18/14  Yes [name], NP  naproxen sodium (ALEVE) 220 MG tablet Take 2 tablets (440 mg total) by mouth 2 (two) times daily  with a meal. 01/28/14  Yes [name], NP  pantoprazole (PROTONIX) 40 MG tablet Take 40 mg by mouth daily.    Yes [name], NP  polyethylene glycol (MIRALAX / GLYCOLAX) packet Take 17 g by mouth daily as needed. 01/28/14  Yes [name], NP  promethazine (PHENERGAN) 25 MG tablet Take 25 mg by mouth every 6 (six) hours as needed for nausea or vomiting.   Yes Historical Provider, MD  sennosides-docusate sodium (SENOKOT-S) 8.6-50 MG tablet Take 2 tablets by mouth daily as needed for  constipation. 01/28/14  Yes [name], NP  Vitamin D, Ergocalciferol, (DRISDOL) 50000 UNITS CAPS capsule Take 50,000 Units by mouth every 7 (seven) days.   Yes Historical Provider, MD   BP 132/87  Pulse 87  Temp(Src) 98.5 F (36.9 C) (Oral)  SpO2 95% Physical Exam  Nursing note and vitals reviewed. Constitutional: She appears well-developed and well-nourished. No distress.  HENT:  Mouth/Throat: Oropharynx is clear and moist.  Eyes: Conjunctivae are normal. No scleral icterus.  Neck: Neck supple. No tracheal deviation present.  No stiffness or rigidity  Cardiovascular: Normal rate, regular rhythm, normal heart sounds and intact distal pulses.   Pulmonary/Chest: Effort normal and breath sounds normal. No respiratory distress.  Abdominal: Soft. Normal appearance and bowel sounds are normal. She exhibits no distension and no mass. There is no tenderness. There is no rebound and no guarding.  Genitourinary:  No cva tenderness  Musculoskeletal: She exhibits no edema.  Neurological: She is alert.  Oriented to person and place. Moves bil ext purposefully w good str, sens intact.   Skin: Skin is warm and dry. No rash noted. She is not diaphoretic.  Psychiatric: She has a normal mood and affect.    ED Course  Procedures (including critical care time) Labs Review  Results for orders placed during the hospital encounter of 05/25/14  CBC      Result Value Ref Range   WBC 9.3  4.0 - 10.5 K/uL   RBC 3.88  3.87 - 5.11 MIL/uL   Hemoglobin 11.9 (*) 12.0 - 15.0 g/dL   HCT 31.5 (*) 94.5 - 85.9 %   MCV 92.5  78.0 - 100.0 fL   MCH 30.7  26.0 - 34.0 pg   MCHC 33.1  30.0 - 36.0 g/dL   RDW 29.2  44.6 - 28.6 %   Platelets 325  150 - 400 K/uL  COMPREHENSIVE METABOLIC PANEL      Result Value Ref Range   Sodium 134 (*) 137 - 147 mEq/L   Potassium 4.4  3.7 - 5.3 mEq/L   Chloride 97  96 - 112 mEq/L   CO2 26  19 - 32 mEq/L   Glucose, Bld 95  70 - 99 mg/dL   BUN 8  6 - 23 mg/dL   Creatinine,  Ser 3.81  0.50 - 1.10 mg/dL   Calcium 9.2  8.4 - 77.1 mg/dL   Total Protein 6.8  6.0 - 8.3 g/dL   Albumin 3.2 (*) 3.5 - 5.2 g/dL   AST 26  0 - 37 U/L   ALT 14  0 - 35 U/L   Alkaline Phosphatase 73  39 - 117 U/L   Total Bilirubin 0.2 (*) 0.3 - 1.2 mg/dL   GFR calc non Af Amer 82 (*) >90 mL/min   GFR calc Af Amer >90  >90 mL/min  URINALYSIS, ROUTINE W REFLEX MICROSCOPIC      Result Value Ref Range   Color, Urine  YELLOW  YELLOW   APPearance CLOUDY (*) CLEAR   Specific Gravity, Urine 1.013  1.005 - 1.030   pH 7.5  5.0 - 8.0   Glucose, UA NEGATIVE  NEGATIVE mg/dL   Hgb urine dipstick MODERATE (*) NEGATIVE   Bilirubin Urine NEGATIVE  NEGATIVE   Ketones, ur NEGATIVE  NEGATIVE mg/dL   Protein, ur NEGATIVE  NEGATIVE mg/dL   Urobilinogen, UA 0.2  0.0 - 1.0 mg/dL   Nitrite NEGATIVE  NEGATIVE   Leukocytes, UA LARGE (*) NEGATIVE  URINE MICROSCOPIC-ADD ON      Result Value Ref Range   Squamous Epithelial / LPF RARE  RARE   WBC, UA 21-50  <3 WBC/hpf   RBC / HPF 0-2  <3 RBC/hpf   Bacteria, UA MANY (*) RARE       MDM  Iv ns. Labs.  Reviewed nursing notes and prior charts for additional history.   uti on labs.  Rocephin iv.   Pt tolerating po fluids and food well during stay in ED.  Family arrives, feels mental state c/w baseline.  Vitals normal.   Pt appears stable for d/c.      Suzi Roots, MD 05/25/14 [phone]

## 2014-05-25 NOTE — Discharge Instructions (Signed)
Give keflex (antibiotic) as prescribed.  Urine tests were sent, the urine culture should be resulted in 2 days time - have your doctor follow up on that result this Monday. Please ensure that patient has access to adequate fluids - have water available at her bedside table. Follow up with primary care doctor regarding frequent urine infections - family asking about the possibility of low dose prophylactic antibiotic treatment, after course of treatment for this uti is completed.  Return to ER if worse, new symptoms, high fevers, change in mental status, persistent vomiting, trouble breathing, other concern.    Urinary Tract Infection Urinary tract infections (UTIs) can develop anywhere along your urinary tract. Your urinary tract is your body's drainage system for removing wastes and extra water. Your urinary tract includes two kidneys, two ureters, a bladder, and a urethra. Your kidneys are a pair of bean-shaped organs. Each kidney is about the size of your fist. They are located below your ribs, one on each side of your spine. CAUSES Infections are caused by microbes, which are microscopic organisms, including fungi, viruses, and bacteria. These organisms are so small that they can only be seen through a microscope. Bacteria are the microbes that most commonly cause UTIs. SYMPTOMS  Symptoms of UTIs may vary by age and gender of the patient and by the location of the infection. Symptoms in young women typically include a frequent and intense urge to urinate and a painful, burning feeling in the bladder or urethra during urination. Older women and men are more likely to be tired, shaky, and weak and have muscle aches and abdominal pain. A fever may mean the infection is in your kidneys. Other symptoms of a kidney infection include pain in your back or sides below the ribs, nausea, and vomiting. DIAGNOSIS To diagnose a UTI, your caregiver will ask you about your symptoms. Your caregiver also will ask  to provide a urine sample. The urine sample will be tested for bacteria and white blood cells. White blood cells are made by your body to help fight infection. TREATMENT  Typically, UTIs can be treated with medication. Because most UTIs are caused by a bacterial infection, they usually can be treated with the use of antibiotics. The choice of antibiotic and length of treatment depend on your symptoms and the type of bacteria causing your infection. HOME CARE INSTRUCTIONS  If you were prescribed antibiotics, take them exactly as your caregiver instructs you. Finish the medication even if you feel better after you have only taken some of the medication.  Drink enough water and fluids to keep your urine clear or pale yellow.  Avoid caffeine, tea, and carbonated beverages. They tend to irritate your bladder.  Empty your bladder often. Avoid holding urine for long periods of time.  Empty your bladder before and after sexual intercourse.  After a bowel movement, women should cleanse from front to back. Use each tissue only once. SEEK MEDICAL CARE IF:   You have back pain.  You develop a fever.  Your symptoms do not begin to resolve within 3 days. SEEK IMMEDIATE MEDICAL CARE IF:   You have severe back pain or lower abdominal pain.  You develop chills.  You have nausea or vomiting.  You have continued burning or discomfort with urination. MAKE SURE YOU:   Understand these instructions.  Will watch your condition.  Will get help right away if you are not doing well or get worse. Document Released: 09/15/2005 Document Revised: 06/06/2012 Document Reviewed: 01/14/2012  ExitCare Patient Information 2014 [place], [place].

## 2014-05-25 NOTE — ED Notes (Addendum)
Pt presents to department via [facility] for evaluation of altered mental status. Pt from [facility]. Family report patient wasn't acting like herself last night. History of UTI. Pt is alert, but confused upon arrival. 18g L forearm. CBG 108. BP 118/70.

## 2014-05-29 LAB — URINE CULTURE: Colony Count: >=100000

## 2014-06-02 ENCOUNTER — Telehealth (HOSPITAL_BASED_OUTPATIENT_CLINIC_OR_DEPARTMENT_OTHER): Payer: Self-pay | Admitting: Emergency Medicine

## 2014-06-02 NOTE — Telephone Encounter (Signed)
Per pharmacist, patient treated with Keflex. Sensitive to same. 

## 2014-06-11 ENCOUNTER — Non-Acute Institutional Stay (SKILLED_NURSING_FACILITY): Payer: Medicare Other | Admitting: Adult Health

## 2014-06-11 DIAGNOSIS — E538 Deficiency of other specified B group vitamins: Secondary | ICD-10-CM

## 2014-06-11 DIAGNOSIS — M545 Low back pain, unspecified: Secondary | ICD-10-CM

## 2014-06-11 DIAGNOSIS — J449 Chronic obstructive pulmonary disease, unspecified: Secondary | ICD-10-CM

## 2014-06-11 DIAGNOSIS — M069 Rheumatoid arthritis, unspecified: Secondary | ICD-10-CM

## 2014-06-11 DIAGNOSIS — K219 Gastro-esophageal reflux disease without esophagitis: Secondary | ICD-10-CM

## 2014-06-11 DIAGNOSIS — F3289 Other specified depressive episodes: Secondary | ICD-10-CM

## 2014-06-11 DIAGNOSIS — F329 Major depressive disorder, single episode, unspecified: Secondary | ICD-10-CM

## 2014-06-19 ENCOUNTER — Encounter: Payer: Self-pay | Admitting: Adult Health

## 2014-06-19 NOTE — Progress Notes (Signed)
Patient ID: Kristen [name], female   DOB: 09/13/1935, 79 y.o.   MRN: [id number]     [facility]  Allergies  Allergen Reactions  . Erythromycin Base     Per MAR  . Penicillins     Per [facility]     Chief Complaint  Patient presents with  . Medical Management of Chronic Issues    HPI:  No problem-specific assessment & plan notes found for this encounter.   Past Medical History  Diagnosis Date  . RA (rheumatoid arthritis) 05/15/2013  . HTN (hypertension) 05/15/2013  . GERD (gastroesophageal reflux disease) 05/15/2013  . COPD (chronic obstructive pulmonary disease) 05/15/2013  . Unspecified constipation 05/15/2013  . UTI (urinary tract infection) 05/15/2013  . HCAP (healthcare-associated pneumonia) 01/13/2014    No past surgical history on file.  VITAL SIGNS BP 106/56  Pulse 78  Ht 5\' 5"  (1.651 m)  Wt 105 lb 6.4 oz (47.809 kg)  BMI 17.54 kg/m2   Patient's Medications  New Prescriptions   No medications on file  Previous Medications   ACETAMINOPHEN (TYLENOL) 500 MG TABLET    Take 500 mg by mouth 2 (two) times daily. 0900 & 1700   DULOXETINE (CYMBALTA) 20 MG CAPSULE    Take 40 mg by mouth daily.   FOLIC ACID (FOLVITE) 1 MG TABLET    Take 1 mg by mouth 2 (two) times daily. 0800 & 1700   LIDOCAINE (LIDODERM) 5 %    Place 1 patch onto the skin daily. Remove & Discard patch within 12 hours to her lower back   METHOTREXATE (RHEUMATREX) 2.5 MG TABLET    Take 12.5 mg by mouth once a week. mondays   METOCLOPRAMIDE (REGLAN) 5 MG TABLET    Take 0.5 tablets (2.5 mg total) by mouth 3 (three) times daily before meals.   NAPROXEN SODIUM (ALEVE) 220 MG TABLET    Take 2 tablets (440 mg total) by mouth 2 (two) times daily with a meal.   PANTOPRAZOLE (PROTONIX) 40 MG TABLET    Take 40 mg by mouth daily.    POLYETHYLENE GLYCOL (MIRALAX / GLYCOLAX) PACKET    Take 17 g by mouth daily as needed.   PROMETHAZINE (PHENERGAN) 25 MG TABLET    Take 25 mg by mouth every 6 (six) hours as needed for nausea  or vomiting.   SENNOSIDES-DOCUSATE SODIUM (SENOKOT-S) 8.6-50 MG TABLET    Take 2 tablets by mouth daily as needed for constipation.   VITAMIN D, ERGOCALCIFEROL, (DRISDOL) 50000 UNITS CAPS CAPSULE    Take 50,000 Units by mouth every 7 (seven) days.  Modified Medications   No medications on file  Discontinued Medications   CEPHALEXIN (KEFLEX) 500 MG CAPSULE    Take 1 capsule (500 mg total) by mouth 4 (four) times daily.   CITALOPRAM (CELEXA) 20 MG TABLET    Take 20 mg by mouth daily.    SIGNIFICANT DIAGNOSTIC EXAMS  05-22-13: Left knee x-ray: moderate to severe osteopenia; mild osteoarthritis no acute fracture malalignment.sall joint effusion  01-08-14: chest x-ray: borderline heart size with slightly elevated pulmonary vasculature suggest mild chf. Very fine interstitial changes both lungs consistent with either pneumonia or interstitial pulmonary edema.     LABS REVIEWED;    07-20-13: glucose 88; bun 7; creat 0.6; k+3.6; na++ 137; vit b12: 1383 01-08-14; wbc 17.5; hgb 13.2; hct 40.0; mcv 92 plt 240; glucose 130; bun 8; creat 1.08; k+4.1; na++137 liver normal albumin 3.4; u/a: + nitrate  Blood culture: no growth Urine culture:  e-coli:  macorbid 01-21-14: glucose 82; bun 13; creat 0.7; k+3.7; na++ 135 vit b12: 1151.0  03-20-14; urine culture: e-coli: septra ds  04-15-14: tsh 3.631; vit d 18.18  05-10-14: wbc 9.7; hgb 11.3; hct 35.0; mcv 96.7; plt 268; glucose 78; bun 11; creat 0.7; k+4.1; na++135; liver normal albumin 3.3 vit 12: 825      Review of Systems  Constitutional: Negative.   Respiratory: Negative for cough.   Cardiovascular: Negative for chest pain.  Musculoskeletal: negative for back pain and joint pain.  Skin: Negative.     Physical Exam  Constitutional: No distress.  frail  Neck: Neck supple. No JVD present.  Breast: breast exam negative  Cardiovascular: Normal rate, regular rhythm and intact distal pulses.   Respiratory: Effort normal. No respiratory distress.    Slightly diminished breath sounds  GI: Soft. Bowel sounds are normal. She exhibits no distension. There is no tenderness.  Musculoskeletal: Normal range of motion. Uses wheelchair  Neurological: She is alert.  Skin: Skin is dry. She is not diaphoretic.      ASSESSMENT/ PLAN:  1. RA: no change in her status: will continue her methotrexate 12.5 mg weekly an folic acid 1 mg twice daily and will monitor her status   2. Back pain: her pain is being managed at this time; will continue aleve 2 tabs twice daily and tylenol 500 mg twice daily; lidoderm to her lower back and will monitor  3. [name]: is stable will continue protonix 40 mg daily will continue  Will stop her reglan at this time and will monitor  4. Depression: she is followed by nceps; will continue her cymbalta 40 mg daily will monitor.    5. Constipation; will continue miralax and senna s to daily as needed will not make changes.   6. Vit b12 deficiency:  Is presently off injections her vit b level is normal will monitor   7. Copd: is stable is presently not on medications; will not make changes will monitor her status.        Synthia Innocent NP [facility]  Contact [phone] Monday through Friday 8am- 5pm  After hours call [phone]

## 2014-07-12 ENCOUNTER — Non-Acute Institutional Stay (SKILLED_NURSING_FACILITY): Payer: Medicare Other | Admitting: Adult Health

## 2014-07-12 ENCOUNTER — Encounter: Payer: Self-pay | Admitting: Adult Health

## 2014-07-12 DIAGNOSIS — F329 Major depressive disorder, single episode, unspecified: Secondary | ICD-10-CM

## 2014-07-12 DIAGNOSIS — M069 Rheumatoid arthritis, unspecified: Secondary | ICD-10-CM

## 2014-07-12 DIAGNOSIS — E559 Vitamin D deficiency, unspecified: Secondary | ICD-10-CM | POA: Insufficient documentation

## 2014-07-12 DIAGNOSIS — M810 Age-related osteoporosis without current pathological fracture: Secondary | ICD-10-CM | POA: Insufficient documentation

## 2014-07-12 DIAGNOSIS — F0390 Unspecified dementia without behavioral disturbance: Secondary | ICD-10-CM

## 2014-07-12 DIAGNOSIS — F3289 Other specified depressive episodes: Secondary | ICD-10-CM

## 2014-07-12 NOTE — Assessment & Plan Note (Signed)
She was started on 50,000 units qweekly in may. Would recheck level in August.

## 2014-07-12 NOTE — Assessment & Plan Note (Signed)
This may be worsened by her depression. Would tx these symptoms and then reevaluate. She was previously on Aricept and this was discontinued.

## 2014-07-12 NOTE — Assessment & Plan Note (Signed)
She has a hx of thoracic compression fractures. She is being treated for back pain with aleve BID and lidoderm patch. She denied pain today. She also refused a DEXA scan in the past. She most likely has osteoporosis. Will try Miacalcin spray daily to treat for pain. I do not think she would be compliant for bisphosphonate use.

## 2014-07-12 NOTE — Assessment & Plan Note (Signed)
Continue current dose of methotrexate. LFTs in May were WNL. Would recheck in 6 months.

## 2014-07-12 NOTE — Assessment & Plan Note (Signed)
She has a hx of recurrent UTI with altered mental status and frequent ER visits. She was placed on Cranberry p.o. Qd and single strength bactrim for prophylaxis.

## 2014-07-12 NOTE — Progress Notes (Signed)
Patient ID: Kristen [name], female   DOB: December 29, 1934, 79 y.o.   MRN: [id number]   [facility] (31)  Code Status:  Full code  Chief Complaint  Patient presents with  . Medical Management of Chronic Issues    HPI: This is a 79 y.o. Female with a hx of RA who resides at [facility]. I am here to review her chronic medical conditions.  She has a hx of depression and is followed by psych. She was previously on Celexa and changed to Cymbalta 40 mg. Upon entering the room she pulled the covers over her head and was very guarded during our conversation. She had no new complaints. Her weight has remained stable at 105.2 lbs.    Allergies  Allergen Reactions  . Erythromycin Base     Per MAR  . Penicillins     Per [facility]    MEDICATIONS -  Reviewed Outpatient Encounter Prescriptions as of 07/12/2014  Medication Sig  . Cranberry 400 MG CAPS Take 400 mg by mouth daily.  Marland Kitchen sulfamethoxazole-trimethoprim (BACTRIM,SEPTRA) 400-80 MG per tablet Take 1 tablet by mouth daily.  Marland Kitchen acetaminophen (TYLENOL) 500 MG tablet Take 500 mg by mouth 2 (two) times daily. 0900 & 1700  . DULoxetine (CYMBALTA) 20 MG capsule Take 40 mg by mouth daily.  . folic acid (FOLVITE) 1 MG tablet Take 1 mg by mouth 2 (two) times daily. 0800 & 1700  . lidocaine (LIDODERM) 5 % Place 1 patch onto the skin daily. Remove & Discard patch within 12 hours to her lower back  . methotrexate (RHEUMATREX) 2.5 MG tablet Take 12.5 mg by mouth once a week. mondays  . naproxen sodium (ALEVE) 220 MG tablet Take 2 tablets (440 mg total) by mouth 2 (two) times daily with a meal.  . pantoprazole (PROTONIX) 40 MG tablet Take 40 mg by mouth daily.   . polyethylene glycol (MIRALAX / GLYCOLAX) packet Take 17 g by mouth daily as needed.  . promethazine (PHENERGAN) 25 MG tablet Take 25 mg by mouth every 6 (six) hours as needed for nausea or vomiting.  . sennosides-docusate sodium (SENOKOT-S) 8.6-50 MG tablet Take 2 tablets by mouth  daily as needed for constipation.  . Vitamin D, Ergocalciferol, (DRISDOL) 50000 UNITS CAPS capsule Take 50,000 Units by mouth every 7 (seven) days.   DATA REVIEWED  SIGNIFICANT DIAGNOSTIC EXAMS  05-22-13: Left knee x-ray: moderate to severe osteopenia; mild osteoarthritis no acute fracture malalignment.sall joint effusion    01-08-14: chest x-ray: borderline heart size with slightly elevated pulmonary vasculature suggest mild chf. Very fine interstitial changes both lungs consistent with either pneumonia or interstitial pulmonary edema.    LABS REVIEWED;    07-20-13: glucose 88; bun 7; creat 0.6; k+3.6; na++ 137; vit b12: 1383  01-08-14; wbc 17.5; hgb 13.2; hct 40.0; mcv 92 plt 240; glucose 130; bun 8; creat 1.08; k+4.1; na++137 liver normal albumin 3.4; u/a: + nitrate  Blood culture: no growth  Urine culture: e-coli: macorbid  01-21-14: glucose 82; bun 13; creat 0.7; k+3.7; na++ 135 vit b12: 1151.0  03-20-14; urine culture: e-coli: septra ds  04-15-14: tsh 3.631; vit d 18.18  05-10-14: wbc 9.7; hgb 11.3; hct 35.0; mcv 96.7; plt 268; glucose 78; bun 11; creat 0.7; k+4.1; na++135; liver normal albumin 3.3 vit 12: 825   Lab REVIEW OF SYSTEMS  DATA OBTAINED: from patient, nurse, medical record, poor historian GENERAL: No recent fever, fatigue, change in activity status, appetite, or weight  RESPIRATORY:  No cough, wheezing, SOB CARDIAC: No chest pain, palpitations. No edema GI: No abdominal pain  No Nausea,vomiting,diarrhea or constipation  No heartburn or reflux  MUSCULOSKELETAL: chronic low back pain and joint pain, denies pain at this time NEUROLOGIC: No dizziness, fainting, headache, numbness No change in mental status  PSYCHIATRIC: h/o depression, sleeps well, no behavior issue  PHYSICAL EXAM Filed Vitals:   07/12/14 1058  BP: 156/86  Pulse: 82  Temp: 97.8 F (36.6 C)  Resp: 20  Weight: 105 lb 3.2 oz (47.718 kg)   Body mass index is 17.51 kg/(m^2). GENERAL APPEARANCE: No acute  distress, appropriately groomed, normal body habitus Alert, pleasant, conversant. SKIN: No diaphoresis, rash, wound HEAD: Normocephalic, atraumatic EYES: Conjunctiva/lids clear  RESPIRATORY: Breathing is even, unlabored  Lung sounds are clear and full  CARDIOVASCULAR: Heart RRR   No murmur or extra heart sounds   EDEMA: No peripheral edema   ABD: BS x4 non distended and non tender MUSCULOSKELETAL.  Decreased strength to BUE and BLE 3/5 PSYCHIATRIC: Guarded with depressed mood.  Alert, oriented to self and situation. She did not know the date or name of the facility and denied living in a nursing home.   ASSESSMENT/PLAN  Dementia This may be worsened by her depression. Would tx these symptoms and then reevaluate. She was previously on Aricept and this was discontinued.  RA (rheumatoid arthritis) Continue current dose of methotrexate. LFTs in May were WNL. Would recheck in 6 months.  Unspecified vitamin D deficiency She was started on 50,000 units qweekly in may. Would recheck level in August.   Osteoporosis, unspecified She has a hx of thoracic compression fractures. She is being treated for back pain with aleve BID and lidoderm patch. She denied pain today. She also refused a DEXA scan in the past. She most likely has osteoporosis. Will try Miacalcin spray daily to treat for pain. I do not think she would be compliant for bisphosphonate use.   Depressive disorder, not elsewhere classified She continues to have a depressed mood. Will increase Cymbalta to 60mg  daily and monitor.   UTI (urinary tract infection) She has a hx of recurrent UTI with altered mental status and frequent ER visits. She was placed on Cranberry p.o. Qd and single strength bactrim for prophylaxis.     NP/Christina Wert RN, MSN  07/12/2014

## 2014-07-12 NOTE — Assessment & Plan Note (Signed)
She continues to have a depressed mood. Will increase Cymbalta to 60mg  daily and monitor.

## 2014-07-16 ENCOUNTER — Other Ambulatory Visit: Payer: Self-pay

## 2014-07-16 ENCOUNTER — Non-Acute Institutional Stay (SKILLED_NURSING_FACILITY): Payer: Medicare Other | Admitting: Adult Health

## 2014-07-16 DIAGNOSIS — N390 Urinary tract infection, site not specified: Secondary | ICD-10-CM

## 2014-07-16 DIAGNOSIS — R404 Transient alteration of awareness: Secondary | ICD-10-CM

## 2014-07-16 DIAGNOSIS — R410 Disorientation, unspecified: Secondary | ICD-10-CM

## 2014-07-16 LAB — CBC WITH DIFFERENTIAL/PLATELET
Basophil #: 0.1 10*3/uL (ref 0.0–0.1)
Basophil %: 0.5 %
Eosinophil #: 0 10*3/uL (ref 0.0–0.7)
Eosinophil %: 0.1 %
HCT: 39.5 % (ref 35.0–47.0)
HGB: 12.8 g/dL (ref 12.0–16.0)
Lymphocyte #: 0.7 10*3/uL — ABNORMAL LOW (ref 1.0–3.6)
Lymphocyte %: 4.1 %
MCH: 30.5 pg (ref 26.0–34.0)
MCHC: 32.3 g/dL (ref 32.0–36.0)
MCV: 95 fL (ref 80–100)
MONO ABS: 0.6 x10 3/mm (ref 0.2–0.9)
Monocyte %: 4 %
Neutrophil #: 14.4 10*3/uL — ABNORMAL HIGH (ref 1.4–6.5)
Neutrophil %: 91.3 %
Platelet: 324 10*3/uL (ref 150–440)
RBC: 4.18 10*6/uL (ref 3.80–5.20)
RDW: 14.5 % (ref 11.5–14.5)
WBC: 15.8 10*3/uL — ABNORMAL HIGH (ref 3.6–11.0)

## 2014-07-16 LAB — COMPREHENSIVE METABOLIC PANEL
ANION GAP: 10 (ref 7–16)
Albumin: 3.4 g/dL (ref 3.4–5.0)
Alkaline Phosphatase: 83 U/L
BUN: 11 mg/dL (ref 7–18)
Bilirubin,Total: 0.5 mg/dL (ref 0.2–1.0)
CALCIUM: 8.6 mg/dL (ref 8.5–10.1)
CHLORIDE: 98 mmol/L (ref 98–107)
Co2: 24 mmol/L (ref 21–32)
Creatinine: 0.99 mg/dL (ref 0.60–1.30)
EGFR (African American): >60
EGFR (Non-African Amer.): 55 — ABNORMAL LOW
GLUCOSE: 121 mg/dL — AB (ref 65–99)
Osmolality: 265 (ref 275–301)
Potassium: 3.8 mmol/L (ref 3.5–5.1)
SGOT(AST): 27 U/L (ref 15–37)
SGPT (ALT): 23 U/L
SODIUM: 132 mmol/L — AB (ref 136–145)
TOTAL PROTEIN: 6.8 g/dL (ref 6.4–8.2)

## 2014-07-19 LAB — URINE CULTURE

## 2014-07-29 NOTE — Progress Notes (Signed)
Patient ID: Kristen [name], female   DOB: 10/05/1935, 79 y.o.   MRN: [id number]     [facility]  Allergies  Allergen Reactions  . Erythromycin Base     Per MAR  . Penicillins     Per [facility]      Chief Complaint  Patient presents with  . Acute Visit    change in status     HPI:  The staff reports that she is very lethargic and not responsive to verbal stimuli. She had been doing well earlier in the day. There are no reports of fever present. She has done this in the past when she has an acute uti. She is on chronic septra single strength daily.   Past Medical History  Diagnosis Date  . RA (rheumatoid arthritis) 05/15/2013  . HTN (hypertension) 05/15/2013  . GERD (gastroesophageal reflux disease) 05/15/2013  . COPD (chronic obstructive pulmonary disease) 05/15/2013  . Unspecified constipation 05/15/2013  . UTI (urinary tract infection) 05/15/2013  . HCAP (healthcare-associated pneumonia) 01/13/2014    No past surgical history on file.  VITAL SIGNS BP 120/55  Pulse 84  Resp 18  Ht 5\' 5"  (1.651 m)  Wt 105 lb (47.628 kg)  BMI 17.47 kg/m2  SpO2 96%   Patient's Medications  New Prescriptions   No medications on file  Previous Medications   ACETAMINOPHEN (TYLENOL) 500 MG TABLET    Take 500 mg by mouth 2 (two) times daily. 0900 & 1700   CRANBERRY 400 MG CAPS    Take 400 mg by mouth daily.   DULOXETINE (CYMBALTA) 20 MG CAPSULE    Take 60 mg by mouth daily.   FOLIC ACID (FOLVITE) 1 MG TABLET    Take 1 mg by mouth 2 (two) times daily. 0800 & 1700   LIDOCAINE (LIDODERM) 5 %    Place 1 patch onto the skin daily. Remove & Discard patch within 12 hours to her lower back   METHOTREXATE (RHEUMATREX) 2.5 MG TABLET    Take 12.5 mg by mouth once a week. mondays   NAPROXEN SODIUM (ALEVE) 220 MG TABLET    Take 2 tablets (440 mg total) by mouth 2 (two) times daily with a meal.   PANTOPRAZOLE (PROTONIX) 40 MG TABLET    Take 40 mg by mouth daily.    POLYETHYLENE GLYCOL (MIRALAX / GLYCOLAX)  PACKET    Take 17 g by mouth daily as needed.   PROMETHAZINE (PHENERGAN) 25 MG TABLET    Take 25 mg by mouth every 6 (six) hours as needed for nausea or vomiting.   SENNOSIDES-DOCUSATE SODIUM (SENOKOT-S) 8.6-50 MG TABLET    Take 2 tablets by mouth daily as needed for constipation.   SULFAMETHOXAZOLE-TRIMETHOPRIM (BACTRIM,SEPTRA) 400-80 MG PER TABLET    Take 1 tablet by mouth daily.   VITAMIN D, ERGOCALCIFEROL, (DRISDOL) 50000 UNITS CAPS CAPSULE    Take 50,000 Units by mouth every 7 (seven) days.  Modified Medications   No medications on file  Discontinued Medications   No medications on file    SIGNIFICANT DIAGNOSTIC EXAMS   05-22-13: Left knee x-ray: moderate to severe osteopenia; mild osteoarthritis no acute fracture malalignment.sall joint effusion  01-08-14: chest x-ray: borderline heart size with slightly elevated pulmonary vasculature suggest mild chf. Very fine interstitial changes both lungs consistent with either pneumonia or interstitial pulmonary edema.     LABS REVIEWED;    07-20-13: glucose 88; bun 7; creat 0.6; k+3.6; na++ 137; vit b12: 1383 01-08-14; wbc 17.5; hgb 13.2; hct  40.0; mcv 92 plt 240; glucose 130; bun 8; creat 1.08; k+4.1; na++137 liver normal albumin 3.4; u/a: + nitrate  Blood culture: no growth Urine culture: e-coli:  macorbid 01-21-14: glucose 82; bun 13; creat 0.7; k+3.7; na++ 135 vit b12: 1151.0  03-20-14; urine culture: e-coli: septra ds  04-15-14: tsh 3.631; vit d 18.18  05-10-14: wbc 9.7; hgb 11.3; hct 35.0; mcv 96.7; plt 268; glucose 78; bun 11; creat 0.7; k+4.1; na++135; liver normal albumin 3.3 vit 12: 825     Review of Systems  Unable to perform ROS    Physical Exam  Constitutional: No distress.  frail  Neck: Neck supple. No JVD present.  Breast: breast exam negative  Cardiovascular: Normal rate, regular rhythm and intact distal pulses.   Respiratory: Effort normal. No respiratory distress.  Slightly diminished breath sounds  GI: Soft. Bowel  sounds are normal. She exhibits no distension. There is no tenderness.  Musculoskeletal: Normal range of motion. Neurological: She is lethargic.  Skin: Skin is dry. She is slightly diaphoretic .       ASSESSMENT/ PLAN:  1. UTI 2. Acute delirium:  Will get cbc; cmp; ua/c&s; and chest x-ray. More than likely this does represent a uti; she normally responds in this manner with this type of infection. After the specimens have been collected; will begin rocephin 1 gm daily for one week; pending culture results and will continue to monitor her status.

## 2014-08-05 ENCOUNTER — Encounter: Payer: Self-pay | Admitting: Internal Medicine

## 2014-08-05 ENCOUNTER — Non-Acute Institutional Stay (SKILLED_NURSING_FACILITY): Payer: Medicare Other | Admitting: Internal Medicine

## 2014-08-05 DIAGNOSIS — N3090 Cystitis, unspecified without hematuria: Secondary | ICD-10-CM

## 2014-08-05 DIAGNOSIS — K5900 Constipation, unspecified: Secondary | ICD-10-CM

## 2014-08-05 DIAGNOSIS — K219 Gastro-esophageal reflux disease without esophagitis: Secondary | ICD-10-CM

## 2014-08-05 DIAGNOSIS — M810 Age-related osteoporosis without current pathological fracture: Secondary | ICD-10-CM

## 2014-08-05 NOTE — Progress Notes (Signed)
Patient ID: Kristen [name], female   DOB: Sep 08, 1935, 79 y.o.   MRN: [id number]    Facility: [facility]   Chief Complaint  Patient presents with  . Medical Management of Chronic Issues   Allergies  Allergen Reactions  . Erythromycin Base     Per MAR  . Penicillins     Per MAR   Code- full code  HPI 79 y/o female pt seen today for routine visit. She mentions that she is not in a good mood today. She denies any concerns. She asks me to not bother her. She would like to take a nap  She has history of dementia, copd, HTN, osteoporosis, RA and constipation. She is followed by psych services No new concern from staff  ROS Pt refused to participate in ros  Past Medical History  Diagnosis Date  . RA (rheumatoid arthritis) 05/15/2013  . HTN (hypertension) 05/15/2013  . GERD (gastroesophageal reflux disease) 05/15/2013  . COPD (chronic obstructive pulmonary disease) 05/15/2013  . Unspecified constipation 05/15/2013  . UTI (urinary tract infection) 05/15/2013  . HCAP (healthcare-associated pneumonia) 01/13/2014   Medication reviewed. See [facility]  Physical exam 99.4, 81, 20, 125/81, 96%  General- elderly female in no acute distress Head- atraumatic, normocephalic Eyes- no pallor, no icterus, no discharge Neck- no lymphadenopathy Cardiovascular- normal s1,s2, no murmurs Respiratory- bilateral clear to auscultation Abdomen- bowel sounds present, soft, non tender Musculoskeletal- decreased strength in all extremities  Neurological- no focal deficit Skin- warm and dry Psychiatry- alert and oriented to person, guarded with flat affect  Labs- 01-21-14: glucose 82; bun 13; creat 0.7; k+3.7; na++ 135 vit b12: 1151.0   03-20-14; urine culture: e-coli: septra ds   04-15-14: tsh 3.631; vit d 18.18   05-10-14: wbc 9.7; hgb 11.3; hct 35.0; mcv 96.7; plt 268; glucose 78; bun 11; creat 0.7; k+4.1; na++135; liver normal albumin 3.3 vit 12: 825   Assessment/plan  GERD stable  as per staff, Decrease protonix to 20 mg daily and reassess  UTI Has hx of recurrent uti. Rocephin completed for uti Bactrim ds for long term prophylaxis Encouraged hdyration and continue cranberry  Osteoporosis Calcium 1500 bid with meals  Constipation Stable, continue miralax and senokot-s

## 2014-08-31 DIAGNOSIS — N3090 Cystitis, unspecified without hematuria: Secondary | ICD-10-CM | POA: Insufficient documentation

## 2014-09-10 ENCOUNTER — Non-Acute Institutional Stay (SKILLED_NURSING_FACILITY): Payer: Medicare Other | Admitting: Adult Health

## 2014-09-10 ENCOUNTER — Encounter: Payer: Self-pay | Admitting: Adult Health Nurse Practitioner

## 2014-09-10 DIAGNOSIS — E559 Vitamin D deficiency, unspecified: Secondary | ICD-10-CM

## 2014-09-10 DIAGNOSIS — F3289 Other specified depressive episodes: Secondary | ICD-10-CM

## 2014-09-10 DIAGNOSIS — F329 Major depressive disorder, single episode, unspecified: Secondary | ICD-10-CM

## 2014-09-10 DIAGNOSIS — M069 Rheumatoid arthritis, unspecified: Secondary | ICD-10-CM

## 2014-09-10 DIAGNOSIS — F0390 Unspecified dementia without behavioral disturbance: Secondary | ICD-10-CM

## 2014-09-10 DIAGNOSIS — N390 Urinary tract infection, site not specified: Secondary | ICD-10-CM

## 2014-09-10 DIAGNOSIS — M810 Age-related osteoporosis without current pathological fracture: Secondary | ICD-10-CM

## 2014-09-10 NOTE — Progress Notes (Signed)
PCP: Kristen Grout, MD  Code Status: Full  Allergies  Allergen Reactions  . Erythromycin Base     Per MAR  . Penicillins     Per [facility]    Chief Complaint: Medical Management of Chronic Health Issues  HPI:  Kristen [name] is a 79 yr old female being seen today for a routine visit. Overall she is doing well. Staff reports patient is not as active in ADLs and getting up out of bed. She reports no new concerns and denies shortness of breath, chest pain, or pain. She is continuing to lose weight. She denies a change in appetite and states she does eat most of her meals. She is on med pass and does well with this.  Did speak to her sister, Kristen [name], regarding weight loss and changes of plan of care to her Cymbalta and possibly adding an appetite enhancer. She was in agreement with this.   Review of Systems:  Constitutional:positive weight loss Negative for fever, chills, malaise/fatigue and diaphoresis.   Respiratory: Negative for cough, sputum production, shortness of breath and wheezing.   Cardiovascular: Negative for chest pain, palpitations, orthopnea and leg swelling.  Gastrointestinal: Negative for heartburn, nausea, vomiting, abdominal pain, diarrhea and constipation.  Genitourinary: Negative for dysuria, urgency, frequency, hematuria and flank pain.  Musculoskeletal: Negative for back pain, falls, joint pain and myalgias.  Skin: Negative for itching and rash.  Neurological: Negative for weakness,dizziness, tingling, focal weakness and headaches.  Psychiatric/Behavioral: Negative for depression and memory loss. The patient is not nervous/anxious.     Past Medical History  Diagnosis Date  . RA (rheumatoid arthritis) 05/15/2013  . HTN (hypertension) 05/15/2013  . GERD (gastroesophageal reflux disease) 05/15/2013  . COPD (chronic obstructive pulmonary disease) 05/15/2013  . Unspecified constipation 05/15/2013  . UTI (urinary tract infection) 05/15/2013  . HCAP (healthcare-associated  pneumonia) 01/13/2014   History reviewed. No pertinent past surgical history. Social History:   reports that she has quit smoking. She does not have any smokeless tobacco history on file. She reports that she does not drink alcohol or use illicit drugs.  History reviewed. No pertinent family history.  Medications:   Medication List       This list is accurate as of: 09/10/14  2:46 PM.  Always use your most recent med list.               acetaminophen 500 MG tablet  Commonly known as:  TYLENOL  Take 500 mg by mouth 2 (two) times daily. 0900 & 1700     Cranberry 400 MG Caps  Take 400 mg by mouth daily.     DULoxetine 20 MG capsule  Commonly known as:  CYMBALTA  Take 40 mg by mouth daily.     folic acid 1 MG tablet  Commonly known as:  FOLVITE  Take 1 mg by mouth 2 (two) times daily. 0800 & 1700     lidocaine 5 %  Commonly known as:  LIDODERM  Place 1 patch onto the skin daily. Remove & Discard patch within 12 hours to her lower back     methotrexate 2.5 MG tablet  Commonly known as:  RHEUMATREX  Take 12.5 mg by mouth once a week. mondays     naproxen sodium 220 MG tablet  Commonly known as:  ALEVE  Take 2 tablets (440 mg total) by mouth 2 (two) times daily with a meal.     pantoprazole 40 MG tablet  Commonly known as:  PROTONIX  Take 40 mg by mouth daily.     polyethylene glycol packet  Commonly known as:  MIRALAX / GLYCOLAX  Take 17 g by mouth daily as needed.     promethazine 25 MG tablet  Commonly known as:  PHENERGAN  Take 25 mg by mouth every 6 (six) hours as needed for nausea or vomiting.     sennosides-docusate sodium 8.6-50 MG tablet  Commonly known as:  SENOKOT-S  Take 2 tablets by mouth daily as needed for constipation.     sulfamethoxazole-trimethoprim 400-80 MG per tablet  Commonly known as:  BACTRIM,SEPTRA  Take 1 tablet by mouth daily.     Vitamin D (Ergocalciferol) 50000 UNITS Caps capsule  Commonly known as:  DRISDOL  Take 50,000 Units  by mouth every 7 (seven) days.          Physical Exam:  Filed Vitals:   09/10/14 1429  BP: 112/72  Pulse: 78  Temp: 98.2 F (36.8 C)  Resp: 18  Weight: 102 lb (46.267 kg)  SpO2: 97%    General- elderly female in no acute distress Neck- no lymphadenopathy, no thyromegaly, no jugular vein distension, no carotid bruit Cardiovascular- normal s1,s2, no murmurs/ rubs/ gallops; no edema noted Respiratory- bilateral clear to auscultation, no wheeze, no rhonchi, no crackles, no use of accessory muscles Abdomen- bowel sounds present, soft, non tender, no organomegaly, no abdominal bruits, no guarding or rigidity Musculoskeletal- able to move all 4 extremities, steady gait, walker/wheelchair, normal range of motion,  Neurological- no focal deficit, 4/5 normal muscle strength Skin- warm and dry Psychiatry- alert and oriented to person, place and time, normal mood and affect    Labs reviewed: Basic Metabolic Panel:  Recent Labs  31/54/00 05/25/14 1450  NA 135* 134*  K 4.1 4.4  CL  --  97  CO2  --  26  GLUCOSE  --  95  BUN 11 8  CREATININE 0.7 0.69  CALCIUM  --  9.2   Liver Function Tests:  Recent Labs  05/10/14 05/25/14 1450  AST 21 26  ALT 14 14  ALKPHOS 68 73  BILITOT  --  0.2*  PROT  --  6.8  ALBUMIN  --  3.2*   CBC:  Recent Labs  05/10/14 05/25/14 1450  WBC 9.7 9.3  HGB 11.3* 11.9*  HCT 35* 35.9*  MCV  --  92.5  PLT 268 325   Labs- 01-21-14: glucose 82; bun 13; creat 0.7; k+3.7; na++ 135 vit b12: 1151.0   03-20-14; urine culture: e-coli: septra ds   04-15-14: tsh 3.631; vit d 18.18   05-10-14: wbc 9.7; hgb 11.3; hct 35.0; mcv 96.7; plt 268; glucose 78; bun 11; creat 0.7; k+4.1; na++135; liver normal albumin 3.3 vit 12: 825  08-11-14: Vit D 49.49; vit B12 629;   Assessment/Plan  1. Dementia Stable; no current issues; not medicated at this time, will continue to monitor.  2. RA (rheumatoid arthritis) Continue current dose of methotrexate. Will check LFTs  in November  3. Unspecified vitamin D deficiency: stable; level at 49.49; will continue 50,000 units qweekly.  4. Osteoporosis, unspecified: She is currently on Vit D and Calicum supplement. Continue Lidocaine patches and Miacalcin spray.    5. Depressive disorder, not elsewhere classified: Her Cymbalta was decreased 8/21 to 30mg  daily, pt tolerated well; will decrease to 20mg  daily; did discuss with patient and sister, .  6. UTI (urinary tract infection): hx of recurrent UTI; no issues recently; Continue Cranberry p.o. Qd and  single strength bactrim for prophylaxis.   7. Weight loss: Will continue medpass. Will start Remeron 7.5mg  PO qhs for 30 days for appetite stimulant and reassess. Sister, Kristen [name], notified and in agreement with this.  Leeanne Mannan, Student-NP

## 2014-09-11 DIAGNOSIS — F0390 Unspecified dementia without behavioral disturbance: Secondary | ICD-10-CM | POA: Insufficient documentation

## 2014-09-11 NOTE — Progress Notes (Signed)
Patient ID: Kristen [name], female   DOB: 01-Aug-1935, 79 y.o.   MRN: [id number]     [facility]     Code Status: Full    Allergies   Allergen  Reactions   .  Erythromycin Base         Per MAR   .  Penicillins         Per [facility]     Chief Complaint: Medical Management of Chronic Health Issues  HPI:   Kristen [name] is a 79 yr old female being seen today for a routine visit. Overall she is doing well. Staff reports patient is not as active in ADLs and getting up out of bed. She reports no new concerns and denies shortness of breath, chest pain, or pain. She is continuing to lose weight. She denies a change in appetite and states she does eat most of her meals. She is on med pass and does well with this.  Did speak to her sister, Darl Pikes, regarding weight loss and changes of plan of care to her Cymbalta and possibly adding an appetite enhancer. She was in agreement with this.   Review of Systems:  Constitutional:positive weight loss Negative for fever, chills, malaise/fatigue and diaphoresis.   Respiratory: Negative for cough, sputum production, shortness of breath and wheezing.   Cardiovascular: Negative for chest pain, palpitations, orthopnea and leg swelling.  Gastrointestinal: Negative for heartburn, nausea, vomiting, abdominal pain, diarrhea and constipation.  Genitourinary: Negative for dysuria, urgency, frequency, hematuria and flank pain.  Musculoskeletal: Negative for back pain, falls, joint pain and myalgias.  Skin: Negative for itching and rash.  Neurological: Negative for weakness,dizziness, tingling, focal weakness and headaches.  Psychiatric/Behavioral: Negative for depression and memory loss. The patient is not nervous/anxious.       Past Medical History   Diagnosis  Date   .  RA (rheumatoid arthritis)  05/15/2013   .  HTN (hypertension)  05/15/2013   .  GERD (gastroesophageal reflux disease)  05/15/2013   .  COPD (chronic obstructive pulmonary disease)  05/15/2013   .   Unspecified constipation  05/15/2013   .  UTI (urinary tract infection)  05/15/2013   .  HCAP (healthcare-associated pneumonia)  01/13/2014    History reviewed. No pertinent past surgical history. Social History:   reports that she has quit smoking. She does not have any smokeless tobacco history on file. She reports that she does not drink alcohol or use illicit drugs.  History reviewed. No pertinent family history.  Medications:    Medication List           This list is accurate as of: 09/10/14  2:46 PM.  Always use your most recent med list.                       acetaminophen 500 MG tablet   Commonly known as:  TYLENOL   Take 500 mg by mouth 2 (two) times daily. 0900 & 1700        Cranberry 400 MG Caps   Take 400 mg by mouth daily.        DULoxetine 20 MG capsule   Commonly known as:  CYMBALTA   Take 40 mg by mouth daily.        folic acid 1 MG tablet   Commonly known as:  FOLVITE   Take 1 mg by mouth 2 (two) times daily. 0800 & 1700        lidocaine 5 %  Commonly known as:  LIDODERM   Place 1 patch onto the skin daily. Remove & Discard patch within 12 hours to her lower back        methotrexate 2.5 MG tablet   Commonly known as:  RHEUMATREX   Take 12.5 mg by mouth once a week. mondays        naproxen sodium 220 MG tablet   Commonly known as:  ALEVE   Take 2 tablets (440 mg total) by mouth 2 (two) times daily with a meal.        pantoprazole 40 MG tablet   Commonly known as:  PROTONIX   Take 40 mg by mouth daily.        polyethylene glycol packet   Commonly known as:  MIRALAX / GLYCOLAX   Take 17 g by mouth daily as needed.        promethazine 25 MG tablet   Commonly known as:  PHENERGAN   Take 25 mg by mouth every 6 (six) hours as needed for nausea or vomiting.        sennosides-docusate sodium 8.6-50 MG tablet   Commonly known as:  SENOKOT-S   Take 2 tablets by mouth daily as needed for constipation.        sulfamethoxazole-trimethoprim 400-80 MG per  tablet   Commonly known as:  BACTRIM,SEPTRA   Take 1 tablet by mouth daily.        Vitamin D (Ergocalciferol) 50000 UNITS Caps capsule   Commonly known as:  DRISDOL   Take 50,000 Units by mouth every 7 (seven) days.             Physical Exam:    Filed Vitals:     09/10/14 1429   BP:  112/72   Pulse:  78   Temp:  98.2 F (36.8 C)   Resp:  18   Weight:  102 lb (46.267 kg)   SpO2:  97%     General- elderly female in no acute distress Neck- no lymphadenopathy, no thyromegaly, no jugular vein distension, no carotid bruit Cardiovascular- normal s1,s2, no murmurs/ rubs/ gallops; no edema noted Respiratory- bilateral clear to auscultation, no wheeze, no rhonchi, no crackles, no use of accessory muscles Abdomen- bowel sounds present, soft, non tender, no organomegaly, no abdominal bruits, no guarding or rigidity Musculoskeletal- able to move all 4 extremities, steady gait, walker/wheelchair, normal range of motion,   Neurological- no focal deficit, 4/5 normal muscle strength Skin- warm and dry Psychiatry- alert and oriented to person, place and time, normal mood and affect   SIGNIFICANT DIAGNOSTIC EXAMS   05-22-13: Left knee x-ray: moderate to severe osteopenia; mild osteoarthritis no acute fracture malalignment.sall joint effusion  01-08-14: chest x-ray: borderline heart size with slightly elevated pulmonary vasculature suggest mild chf. Very fine interstitial changes both lungs consistent with either pneumonia or interstitial pulmonary edema.     LABS REVIEWED;    07-20-13: glucose 88; bun 7; creat 0.6; k+3.6; na++ 137; vit b12: 1383 01-08-14; wbc 17.5; hgb 13.2; hct 40.0; mcv 92 plt 240; glucose 130; bun 8; creat 1.08; k+4.1; na++137 liver normal albumin 3.4; u/a: + nitrate  Blood culture: no growth Urine culture: e-coli:  macorbid 01-21-14: glucose 82; bun 13; creat 0.7; k+3.7; na++ 135 vit b12: 1151.0  03-20-14; urine culture: e-coli: septra ds  04-15-14: tsh 3.631; vit d  18.18  05-10-14: wbc 9.7; hgb 11.3; hct 35.0; mcv 96.7; plt 268; glucose 78; bun 11; creat 0.7; k+4.1; na++135; liver normal albumin 3.3  vit 12: 825  08-11-14: Vit D 49.49; vit B12 629;   Assessment/Plan  1. Dementia Stable; no current issues; not medicated at this time, will continue to monitor.  2. RA (rheumatoid arthritis) Continue current dose of methotrexate. Will check LFTs in November  3. Unspecified vitamin D deficiency: stable; level at 49.49; will continue 50,000 units qweekly.  4. Osteoporosis, unspecified: She is currently on Vit D and Calicum supplement. Continue Lidocaine patches and Miacalcin spray.     5. Depressive disorder, not elsewhere classified: Her Cymbalta was decreased 8/21 to 30mg  daily, pt tolerated well; will decrease to 20mg  daily; did discuss with patient and sister, .  6. UTI (urinary tract infection): hx of recurrent UTI; no issues recently; Continue Cranberry p.o. Qd and single strength bactrim daily for prophylaxis.   7. Weight loss: Will continue medpass. Will start Remeron 7.5mg  PO qhs for 30 days for appetite stimulant and reassess. Sister, , notified and in agreement with this.  Darl Pikes, Student-NP    Darl Pikes NP [facility]  Contact [phone] Monday through Friday 8am- 5pm  After hours call [phone]

## 2014-09-11 NOTE — Progress Notes (Signed)
This encounter was created in error - please disregard.

## 2014-10-14 ENCOUNTER — Non-Acute Institutional Stay (SKILLED_NURSING_FACILITY): Payer: Medicare Other | Admitting: Adult Health

## 2014-10-14 DIAGNOSIS — M069 Rheumatoid arthritis, unspecified: Secondary | ICD-10-CM

## 2014-10-14 DIAGNOSIS — F32A Depression, unspecified: Secondary | ICD-10-CM | POA: Insufficient documentation

## 2014-10-14 DIAGNOSIS — R634 Abnormal weight loss: Secondary | ICD-10-CM | POA: Insufficient documentation

## 2014-10-14 DIAGNOSIS — F329 Major depressive disorder, single episode, unspecified: Secondary | ICD-10-CM

## 2014-10-14 DIAGNOSIS — N390 Urinary tract infection, site not specified: Secondary | ICD-10-CM

## 2014-10-14 DIAGNOSIS — M810 Age-related osteoporosis without current pathological fracture: Secondary | ICD-10-CM

## 2014-10-14 NOTE — Progress Notes (Signed)
Kristen [name]   Code Status: Full  Allergies  Allergen Reactions  . Erythromycin Base     Per MAR  . Penicillins     Per [facility]    Chief Complaint: Medical Management of Chronic Health Issues  HPI:  Kristen [name] is a 79 yr old female being seen today for a routine visit. Overall she is doing well. She is getting up out of bed more and remains more active in ADLs.  she was started on Remeron for weight loss. Her mood is stable and her weight remains stable at 102 with an increase in appetite. She denies any chest pain, shortness of breath, or pain.   Review of Systems:  Constitutional: Negative for fever, chills, weight loss, malaise/fatigue and diaphoresis.   Respiratory: Negative for cough, sputum production, shortness of breath and wheezing.   Cardiovascular: Negative for chest pain, palpitations, orthopnea and leg swelling.  Gastrointestinal: Negative for heartburn, nausea, vomiting, abdominal pain, diarrhea and constipation.  Genitourinary: Negative for dysuria, urgency, frequency, hematuria and flank pain.  Musculoskeletal: Negative for back pain, falls, joint pain and myalgias.  Skin: Negative for itching and rash.  Neurological: Negative for weakness,dizziness, tingling, focal weakness and headaches.  Psychiatric/Behavioral: Positive depression. The patient is not nervous/anxious.     Past Medical History  Diagnosis Date  . RA (rheumatoid arthritis) 05/15/2013  . HTN (hypertension) 05/15/2013  . GERD (gastroesophageal reflux disease) 05/15/2013  . COPD (chronic obstructive pulmonary disease) 05/15/2013  . Unspecified constipation 05/15/2013  . UTI (urinary tract infection) 05/15/2013  . HCAP (healthcare-associated pneumonia) 01/13/2014   No past surgical history on file. Social History:   reports that she has quit smoking. She does not have any smokeless tobacco history on file. She reports that she does not drink alcohol or use illicit drugs.  No family history on  file.  Medications: Patient's Medications  New Prescriptions   No medications on file  Previous Medications   ACETAMINOPHEN (TYLENOL) 500 MG TABLET    Take 500 mg by mouth 2 (two) times daily. 0900 & 1700   CRANBERRY 400 MG CAPS    Take 400 mg by mouth daily.   FOLIC ACID (FOLVITE) 1 MG TABLET    Take 1 mg by mouth 2 (two) times daily. 0800 & 1700   remeron 7.5mg  Take 7.5 mg nightly    LIDOCAINE (LIDODERM) 5 %    [name] 1 patch onto the skin daily. Remove & Discard patch within 12 hours to her lower back   METHOTREXATE (RHEUMATREX) 2.5 MG TABLET    Take 12.5 mg by mouth once a week. mondays   NAPROXEN SODIUM (ALEVE) 220 MG TABLET    Take 2 tablets (440 mg total) by mouth 2 (two) times daily with a meal.   PANTOPRAZOLE (PROTONIX) 40 MG TABLET    Take 40 mg by mouth daily.    POLYETHYLENE GLYCOL (MIRALAX / GLYCOLAX) PACKET    Take 17 g by mouth daily as needed.   PROMETHAZINE (PHENERGAN) 25 MG TABLET    Take 25 mg by mouth every 6 (six) hours as needed for nausea or vomiting.   SENNOSIDES-DOCUSATE SODIUM (SENOKOT-S) 8.6-50 MG TABLET    Take 2 tablets by mouth daily as needed for constipation.   SULFAMETHOXAZOLE-TRIMETHOPRIM (BACTRIM,SEPTRA) 400-80 MG PER TABLET    Take 1 tablet by mouth daily.   VITAMIN D, ERGOCALCIFEROL, (DRISDOL) 50000 UNITS CAPS CAPSULE    Take 50,000 Units by mouth every 7 (seven) days.  Modified Medications  No medications on file  Discontinued Medications   No medications on file     Physical Exam: Filed Vitals:   10/14/14 1148  BP: 136/60  Pulse: 74  Temp: 97.4 F (36.3 C)  Resp: 20   General- elderly female in no acute distress Neck- no lymphadenopathy, no thyromegaly, no jugular vein distension, no carotid bruit Cardiovascular- normal s1,s2, no murmurs/ rubs/ gallops Respiratory- bilateral clear to auscultation, no wheeze, no rhonchi, no crackles, no use of accessory muscles Abdomen- bowel sounds present, soft, non tender, no  distention Musculoskeletal- able to move all 4 extremities,  steady gait, walker dependent; normal range of motion, no leg edema Neurological- alert; 4/5 normal muscle strength, Skin- warm and dry Psychiatry- alert and oriented to person, normal mood and affect   SIGNIFICANT DIAGNOSTIC EXAMS   05-22-13: Left knee x-ray: moderate to severe osteopenia; mild osteoarthritis no acute fracture malalignment.sall joint effusion  01-08-14: chest x-ray: borderline heart size with slightly elevated pulmonary vasculature suggest mild chf. Very fine interstitial changes both lungs consistent with either pneumonia or interstitial pulmonary edema.     LABS REVIEWED;   01-08-14; wbc 17.5; hgb 13.2; hct 40.0; mcv 92 plt 240; glucose 130; bun 8; creat 1.08; k+4.1; na++137 liver normal albumin 3.4; u/a: + nitrate  Blood culture: no growth Urine culture: e-coli:  macorbid 01-21-14: glucose 82; bun 13; creat 0.7; k+3.7; na++ 135 vit b12: 1151.0  03-20-14; urine culture: e-coli: septra ds  04-15-14: tsh 3.631; vit d 18.18  05-10-14: wbc 9.7; hgb 11.3; hct 35.0; mcv 96.7; plt 268; glucose 78; bun 11; creat 0.7; k+4.1; na++135; liver normal albumin 3.3 vit 12: 825  08-11-14: Vit D 49.49; vit B12 629;    Assessment/Plan 1. Dementia Stable; no current issues; not medicated at this time, will continue to monitor.  2. RA (rheumatoid arthritis): no complaints of pain at this time; Continue current dose of methotrexate. Will check LFTs in November  3. Unspecified vitamin D deficiency: stable; level at 49.49; will continue 50,000 units qweekly.  4. Osteoporosis, unspecified: She is currently on Vit D and Calicum supplement. Continue Lidocaine patches and Miacalcin spray.     5. Depressive disorder, not elsewhere classified: stable; her Cymbalta was recently discontinued 9/23; she appears to be doing well.   6. UTI (urinary tract infection): hx of recurrent UTI; no issues recently; Continue Cranberry p.o. Qd and single  strength bactrim daily for prophylaxis.   7. Weight loss: stable; no loss this month; Will continue medpass. Continue Remeron 7.5mg  PO qhs for 30 days for appetite stimulant and reassess.   Leeanne Mannan, Student-NP    Synthia Innocent NP [facility]  Contact [phone] Monday through Friday 8am- 5pm  After hours call [phone]

## 2014-10-15 LAB — CBC AND DIFFERENTIAL
HCT: 33 % — AB (ref 36–46)
HEMOGLOBIN: 10.5 g/dL — AB (ref 12.0–16.0)
Platelets: 285 10*3/uL (ref 150–399)
WBC: 7.6 10*3/mL

## 2014-11-06 ENCOUNTER — Non-Acute Institutional Stay (SKILLED_NURSING_FACILITY): Payer: Medicare Other | Admitting: Registered Nurse

## 2014-11-06 ENCOUNTER — Encounter: Payer: Self-pay | Admitting: Registered Nurse

## 2014-11-06 DIAGNOSIS — F32A Depression, unspecified: Secondary | ICD-10-CM

## 2014-11-06 DIAGNOSIS — E559 Vitamin D deficiency, unspecified: Secondary | ICD-10-CM

## 2014-11-06 DIAGNOSIS — M069 Rheumatoid arthritis, unspecified: Secondary | ICD-10-CM

## 2014-11-06 DIAGNOSIS — F329 Major depressive disorder, single episode, unspecified: Secondary | ICD-10-CM

## 2014-11-06 DIAGNOSIS — M545 Low back pain: Secondary | ICD-10-CM

## 2014-11-06 DIAGNOSIS — M810 Age-related osteoporosis without current pathological fracture: Secondary | ICD-10-CM

## 2014-11-06 DIAGNOSIS — K219 Gastro-esophageal reflux disease without esophagitis: Secondary | ICD-10-CM

## 2014-11-06 DIAGNOSIS — F0390 Unspecified dementia without behavioral disturbance: Secondary | ICD-10-CM

## 2014-11-06 NOTE — Progress Notes (Signed)
Patient ID: Kristen [name], female   DOB: [birth year], 79 y.o.   MRN: [id number]   Place of Service: [facility]  Allergies  Allergen Reactions  . Erythromycin Base     Per MAR  . Penicillins     Per MAR    Code Status: Full Code  Goals of Care: Longevity/Long term care  Chief Complaint  Patient presents with  . Medical Management of Chronic Issues    dementia, RA, vit D def, osteoporosis, depression    HPI 79 y.o. female with PMH of dementia, RA, vit D deficiency, osteoporosis, depression among others being seen for a routine visit for management of her chronic issues. Weight stable. No recent falls or skin concerns reported. No change in functional status or change in behaviors reported. No concerns from staff. No complaints verbalized from patient.   Review of Systems Constitutional: Negative for fever, chills, and fatigue. HENT: Negative for ear pain, congestion, and sore throat Eyes: Negative for eye pain, eye discharge, and visual disturbance  Cardiovascular: Negative for chest pain, palpitations, and leg swelling Respiratory: Negative cough, shortness of breath, and wheezing.  Gastrointestinal: Negative for nausea and vomiting.  Genitourinary: Negative for dysuria. Musculoskeletal: Negative for back pain, joint pain, and joint swelling  Neurological: Negative for dizziness and headache. Skin: Negative for rash and wound.   Psychiatric: Negative for depression.  Past Medical History  Diagnosis Date  . RA (rheumatoid arthritis) 05/15/2013  . HTN (hypertension) 05/15/2013  . GERD (gastroesophageal reflux disease) 05/15/2013  . COPD (chronic obstructive pulmonary disease) 05/15/2013  . Unspecified constipation 05/15/2013  . UTI (urinary tract infection) 05/15/2013  . HCAP (healthcare-associated pneumonia) 01/13/2014    No past surgical history on file.  History   Social History  . Marital Status: Single    Spouse Name: N/A    Number of Children: N/A  .  Years of Education: N/A   Occupational History  . Not on file.   Social History Main Topics  . Smoking status: Former Games developer  . Smokeless tobacco: Not on file  . Alcohol Use: No  . Drug Use: No  . Sexual Activity: Not Currently   Other Topics Concern  . Not on file   Social History Narrative      Medication List       This list is accurate as of: 11/06/14 10:35 AM.  Always use your most recent med list.               acetaminophen 500 MG tablet  Commonly known as:  TYLENOL  Take 500 mg by mouth 2 (two) times daily. 0900 & 1700     cholecalciferol 1000 UNITS tablet  Commonly known as:  VITAMIN D  Take 1,000 Units by mouth daily.     Cranberry 400 MG Caps  Take 400 mg by mouth daily.     DULoxetine 20 MG capsule  Commonly known as:  CYMBALTA  Take 40 mg by mouth daily.     folic acid 1 MG tablet  Commonly known as:  FOLVITE  Take 1 mg by mouth 2 (two) times daily. 0800 & 1700     lidocaine 5 %  Commonly known as:  LIDODERM  Place 1 patch onto the skin daily. Remove & Discard patch within 12 hours to her lower back     methotrexate 2.5 MG tablet  Commonly known as:  RHEUMATREX  Take 12.5 mg by mouth once a week. mondays     naproxen  sodium 220 MG tablet  Commonly known as:  ALEVE  Take 2 tablets (440 mg total) by mouth 2 (two) times daily with a meal.     pantoprazole 40 MG tablet  Commonly known as:  PROTONIX  Take 40 mg by mouth daily.     polyethylene glycol packet  Commonly known as:  MIRALAX / GLYCOLAX  Take 17 g by mouth daily as needed.     promethazine 25 MG tablet  Commonly known as:  PHENERGAN  Take 25 mg by mouth every 6 (six) hours as needed for nausea or vomiting.     sennosides-docusate sodium 8.6-50 MG tablet  Commonly known as:  SENOKOT-S  Take 2 tablets by mouth daily as needed for constipation.     sulfamethoxazole-trimethoprim 400-80 MG per tablet  Commonly known as:  BACTRIM,SEPTRA  Take 1 tablet by mouth daily.         Physical Exam Filed Vitals:   11/05/14 1400  BP: 118/76  Pulse: 78  Temp: 97.8 F (36.6 C)  Resp: 20   Constitutional: thin adult/elderly female in no acute distress.  HEENT: Normocephalic and atraumatic. PERRL. EOM intact. No icterus. External auditory canals patent, auricles without lesions. No nasal discharge or sinus tenderness. Oral mucosa moist. Posterior pharynx clear of any exudate or lesions. Teeth and gingiva in good general condition.  Neck: Supple and nontender. No lymphadenopathy, masses, or thyromegaly. No JVD or carotid bruits. Cardiac: Normal S1, S2. RRR without appreciable murmurs, rubs, or gallops. Distal pulses intact. No dependent edema.  Lungs: No respiratory distress. Breath sounds clear bilaterally without rales, rhonchi, or wheezes. Abdomen: Audible bowel sounds in all quadrants. Soft, nontender, nondistended. Musculoskeletal: able to move all extremities. No joint erythema or tenderness. Skin: Warm and dry. No rash noted. No erythema.  Neurological: Alert and oriented to person Psychiatric:  Appropriate mood and affect.   Labs Reviewed CBC Latest Ref Rng 05/25/2014 05/10/2014 08/11/2013  WBC 4.0 - 10.5 K/uL 9.3 9.7 11.5(H)  Hemoglobin 12.0 - 15.0 g/dL 11.9(L) 11.3(A) 13.0  Hematocrit 36.0 - 46.0 % 35.9(L) 35(A) 37.0  Platelets 150 - 400 K/uL 325 268 283    CMP     Component Value Date/Time   NA 134* 05/25/2014 1450   NA 135* 05/10/2014   K 4.4 05/25/2014 1450   CL 97 05/25/2014 1450   CO2 26 05/25/2014 1450   GLUCOSE 95 05/25/2014 1450   BUN 8 05/25/2014 1450   BUN 11 05/10/2014   CREATININE 0.69 05/25/2014 1450   CREATININE 0.7 05/10/2014   CALCIUM 9.2 05/25/2014 1450   PROT 6.8 05/25/2014 1450   ALBUMIN 3.2* 05/25/2014 1450   AST 26 05/25/2014 1450   ALT 14 05/25/2014 1450   ALKPHOS 73 05/25/2014 1450   BILITOT 0.2* 05/25/2014 1450   GFRNONAA 82* 05/25/2014 1450   GFRAA >90 05/25/2014 1450   Assessment & Plan 1. RA (rheumatoid  arthritis) Stable. Continue methotrexate 12.5mg  weekly. Will check LFTs. Continue to monitor  2. Osteoporosis Stable. Continue calcitonin spray daily, oscal 1500mg  BID, and vitD 1000 iu daily. Continue fall risk precautions and monitor.  3. Dementia without behavioral disturbance Stable. Not on mediations at this time. Continue to monitor for change in behaviors  4. Low back pain without sciatica, unspecified back pain laterality Stable. Continue lidoderm patch, aleve 440mg  BID, and tylenol 500mg  BID. Continue to monitor.  5. Vitamin D deficiency Stable. Continue vit D 1000iu daily and monitor  6. Gastroesophageal reflux disease without esophagitis Asymptomatic. D/c protonix.  If symptoms returns, will consider H2 blocker.    7. Depression Stable. Continue remeron 7.5mg  daily at bedtime and monitor for change in mood.   Labs Ordered: TSH and CMP  Family/Staff Communication Plan of care discuss with nursing staff. Nursing staff verbalize understanding and agree with plan of care. No additional questions or concerns reported.   Loura Back, MSN, AGNP-C [facility] [address] [place], [place] [address] [phone] [8am-5pm] After hours: [phone]

## 2014-11-07 LAB — BASIC METABOLIC PANEL
BUN: 14 mg/dL (ref 4–21)
Creatinine: 0.8 mg/dL (ref 0.5–1.1)
GLUCOSE: 74 mg/dL
POTASSIUM: 4.4 mmol/L (ref 3.4–5.3)
SODIUM: 138 mmol/L (ref 137–147)

## 2014-11-07 LAB — HEPATIC FUNCTION PANEL
ALT: 18 U/L (ref 7–35)
AST: 24 U/L (ref 13–35)
Alkaline Phosphatase: 59 U/L (ref 25–125)
Bilirubin, Total: 0.4 mg/dL

## 2014-11-07 LAB — TSH: TSH: 4.01 u[IU]/mL (ref 0.41–5.90)

## 2014-11-29 ENCOUNTER — Non-Acute Institutional Stay (SKILLED_NURSING_FACILITY): Payer: Medicare Other | Admitting: Registered Nurse

## 2014-11-29 DIAGNOSIS — F329 Major depressive disorder, single episode, unspecified: Secondary | ICD-10-CM

## 2014-11-29 DIAGNOSIS — E559 Vitamin D deficiency, unspecified: Secondary | ICD-10-CM

## 2014-11-29 DIAGNOSIS — Z87440 Personal history of urinary (tract) infections: Secondary | ICD-10-CM

## 2014-11-29 DIAGNOSIS — M069 Rheumatoid arthritis, unspecified: Secondary | ICD-10-CM

## 2014-11-29 DIAGNOSIS — K219 Gastro-esophageal reflux disease without esophagitis: Secondary | ICD-10-CM

## 2014-11-29 DIAGNOSIS — F32A Depression, unspecified: Secondary | ICD-10-CM

## 2014-11-29 DIAGNOSIS — M810 Age-related osteoporosis without current pathological fracture: Secondary | ICD-10-CM

## 2014-11-29 DIAGNOSIS — F0390 Unspecified dementia without behavioral disturbance: Secondary | ICD-10-CM

## 2014-11-29 DIAGNOSIS — M545 Low back pain: Secondary | ICD-10-CM

## 2014-12-01 ENCOUNTER — Encounter: Payer: Self-pay | Admitting: Registered Nurse

## 2014-12-01 NOTE — Progress Notes (Signed)
Patient ID: Kristen [name], female   DOB: Dec 02, 1935, 79 y.o.   MRN: [id number]   Place of Service: [facility]  Allergies  Allergen Reactions  . Erythromycin Base     Per MAR  . Penicillins     Per MAR    Code Status: Full Code  Goals of Care: Longevity/Long term care  Chief Complaint  Patient presents with  . Medical Management of Chronic Issues    RA, osteoporosis, dementia, hx recurrent UTI, low back pain with sciatica, vit D def., GERD, depression    HPI 79 y.o. female with PMH of dementia, RA, vit D deficiency, recurrent UTI, osteoporosis, depression among others being seen for a routine visit for management of her chronic issues. Weight stable over the past month. No recent falls reported. Has a small skin tear to left upper arm. No change in functional status or change in behaviors reported. No concerns from staff. No complaints verbalized from patient. Low back pain is adequately controlled with topical analgesic, tylenol, and naproxen. No recent UTI reported-she is still on cranberry capsule and bactrim for UTI prophylaxis. RA is stable on weekly methotrexate. No issues with depression since discontinuation of cymbalta and remeron.  Review of Systems Constitutional: Negative for fever, chills, and fatigue. HENT: Negative for ear pain, congestion, and sore throat Eyes: Negative for eye pain, eye discharge, and visual disturbance  Cardiovascular: Negative for chest pain, palpitations, and leg swelling Respiratory: Negative cough, shortness of breath, and wheezing.  Gastrointestinal: Negative for nausea and vomiting. Negative for abdominal pain Genitourinary: Negative for dysuria. Musculoskeletal: Negative for back pain, joint pain, and joint swelling  Neurological: Negative for dizziness and headache. Skin: Negative for rash and wound.   Psychiatric: Negative for depression.  Past Medical History  Diagnosis Date  . RA (rheumatoid arthritis) 05/15/2013  . HTN  (hypertension) 05/15/2013  . GERD (gastroesophageal reflux disease) 05/15/2013  . COPD (chronic obstructive pulmonary disease) 05/15/2013  . Unspecified constipation 05/15/2013  . UTI (urinary tract infection) 05/15/2013  . HCAP (healthcare-associated pneumonia) 01/13/2014    No past surgical history on file.  History   Social History  . Marital Status: Single    Spouse Name: N/A    Number of Children: N/A  . Years of Education: N/A   Occupational History  . Not on file.   Social History Main Topics  . Smoking status: Former Games developer  . Smokeless tobacco: Not on file  . Alcohol Use: No  . Drug Use: No  . Sexual Activity: Not Currently   Other Topics Concern  . Not on file   Social History Narrative      Medication List       This list is accurate as of: 11/29/14 11:59 PM.  Always use your most recent med list.               acetaminophen 500 MG tablet  Commonly known as:  TYLENOL  Take 500 mg by mouth 2 (two) times daily. 0900 & 1700     cholecalciferol 1000 UNITS tablet  Commonly known as:  VITAMIN D  Take 1,000 Units by mouth daily.     Cranberry 400 MG Caps  Take 400 mg by mouth daily.     folic acid 1 MG tablet  Commonly known as:  FOLVITE  Take 1 mg by mouth 2 (two) times daily. 0800 & 1700     lidocaine 5 %  Commonly known as:  LIDODERM  Place 1 patch  onto the skin daily. Remove & Discard patch within 12 hours to her lower back     methotrexate 2.5 MG tablet  Commonly known as:  RHEUMATREX  Take 12.5 mg by mouth once a week. mondays     mirtazapine 7.5 MG tablet  Commonly known as:  REMERON  Take 7.5 mg by mouth at bedtime.     naproxen sodium 220 MG tablet  Commonly known as:  ALEVE  Take 2 tablets (440 mg total) by mouth 2 (two) times daily with a meal.     pantoprazole 20 MG tablet  Commonly known as:  PROTONIX  Take 20 mg by mouth daily.     polyethylene glycol packet  Commonly known as:  MIRALAX / GLYCOLAX  Take 17 g by mouth daily  as needed.     promethazine 25 MG tablet  Commonly known as:  PHENERGAN  Take 25 mg by mouth every 6 (six) hours as needed for nausea or vomiting.     sennosides-docusate sodium 8.6-50 MG tablet  Commonly known as:  SENOKOT-S  Take 2 tablets by mouth daily as needed for constipation.     sulfamethoxazole-trimethoprim 400-80 MG per tablet  Commonly known as:  BACTRIM,SEPTRA  Take 1 tablet by mouth daily.        Physical Exam  BP 119/70 mmHg  Pulse 70  Temp(Src) 97.9 F (36.6 C)  Resp 20  Ht 5\' 5"  (1.651 m)  Wt 103 lb 9.6 oz (46.993 kg)  BMI 17.24 kg/m2  SpO2 99%  Constitutional: thin elderly female in no acute distress. Conversant and pleasant HEENT: Normocephalic and atraumatic. PERRL. EOM intact. No icterus. No nasal discharge or sinus tenderness. Oral mucosa moist. Posterior pharynx clear of any exudate or lesions.  Neck: Supple and nontender. No lymphadenopathy, masses, or thyromegaly. No JVD or carotid bruits. Cardiac: Normal S1, S2. RRR without appreciable murmurs, rubs, or gallops. Distal pulses intact. No dependent edema.  Lungs: No respiratory distress. Breath sounds clear bilaterally without rales, rhonchi, or wheezes. Abdomen: Audible bowel sounds in all quadrants. Soft, nontender, nondistended. Musculoskeletal: able to move all extremities. No joint erythema or tenderness. Skin: Warm and dry. No rash noted. No erythema.  Neurological: Alert and oriented to self. Psychiatric:  Appropriate mood and affect.   Labs Reviewed CBC Latest Ref Rng 10/15/2014 05/25/2014 05/10/2014  WBC - 7.6 9.3 9.7  Hemoglobin 12.0 - 16.0 g/dL 10.5(A) 11.9(L) 11.3(A)  Hematocrit 36 - 46 % 33(A) 35.9(L) 35(A)  Platelets 150 - 399 K/L 285 325 268   CMP Latest Ref Rng 11/07/2014 05/25/2014 05/10/2014  Glucose 70 - 99 mg/dL - 95 -  BUN 4 - 21 mg/dL 14 8 11   Creatinine 0.5 - 1.1 mg/dL 0.8 05/12/2014 0.7  Sodium - 147 mmol/L 138 134(L) 135(A)  Potassium 3.4 - 5.3 mmol/L 4.4 4.4 4.1  Chloride  96 - 112 mEq/L - 97 -  CO2 19 - 32 mEq/L - 26 -  Calcium 8.4 - 10.5 mg/dL - 9.2 -  Total Protein 6.0 - 8.3 g/dL - 6.8 -  Total Bilirubin 0.3 - 1.2 mg/dL - 9.70) -  Alkaline Phos 25 - 125 U/L 59 73 68  AST 13 - 35 U/L 24 26 21   ALT 7 - 35 U/L 18 14 14     Lab Results  Component Value Date   TSH 4.01 11/07/2014    Assessment & Plan  1. Gastroesophageal reflux disease without esophagitis Patient denies any discomfort since discontinuation of protonix. Per nursing  staff, patient has really bad acid reflux, especially after eating. Will restart protonix 20mg  daily as she is also on scheduled naproxen. Continue to monitor for now.   2. RA (rheumatoid arthritis) Stable. Continue methotrexate 12.5mg  once weekly. Recent LFTs normal. Continue to monitor  3. Osteoporosis Stable. Continue calcitonin spray daily, oscal 1500mg  twice daily, and vitD 1000 iu daily. Continue fall risk precautions and monitor.  4. Depression No issues since discontinuation of cymbalta. Continue remeron 7.5mg  daily at bedtime and continue to monitor for change in mood.   5. Dementia without behavioral disturbance Stable at this point. She is not on any medication at this time. Continue to monitor for change in behaviors. Continue fall risk and pressure ulcer precautions.   6. Low back pain without sciatica, unspecified back pain laterality Stable. Continue lidocaine 5% patch to lower back daily (12 hrs on and 12hrs off), naproxen 440mg  twice daily, and tylenol 500mg  twice daily. Continue to monitor.   7. Vitamin D deficiency Stable. Continue vitD 1000 units by mouth daily and monitor.   8. History of recurrent UTIs Stable. No recent uti reported. Continue cranberry 400mg  daily with bactrim 400/80mg  daily for uti prophylaxis. Continue to monitor   Family/Staff Communication Plan of care discussed with nursing staff. Nursing staff verbalized understanding and agree with plan of care. No additional questions or  concerns reported.   , MSN, AGNP-C [facility] [address] [place], Loura Back [phone] [8am-5pm] After hours: [phone]

## 2014-12-30 ENCOUNTER — Non-Acute Institutional Stay (SKILLED_NURSING_FACILITY): Payer: Medicare Other | Admitting: Internal Medicine

## 2014-12-30 DIAGNOSIS — M069 Rheumatoid arthritis, unspecified: Secondary | ICD-10-CM

## 2014-12-30 DIAGNOSIS — D638 Anemia in other chronic diseases classified elsewhere: Secondary | ICD-10-CM

## 2014-12-30 DIAGNOSIS — E46 Unspecified protein-calorie malnutrition: Secondary | ICD-10-CM

## 2014-12-30 DIAGNOSIS — N390 Urinary tract infection, site not specified: Secondary | ICD-10-CM

## 2014-12-30 LAB — CBC AND DIFFERENTIAL
HEMATOCRIT: 29 % — AB (ref 36–46)
Hemoglobin: 9.3 g/dL — AB (ref 12.0–16.0)
Platelets: 224 10*3/uL (ref 150–399)
WBC: 6.3 10^3/mL

## 2014-12-30 NOTE — Progress Notes (Signed)
Patient ID: Kristen [name], female   DOB: 02-Sep-1935, 80 y.o.   MRN: [id number]    Facility: [facility]   Chief Complaint  Patient presents with  . Medical Management of Chronic Issues   Allergies  Allergen Reactions  . Erythromycin Base     Per MAR  . Penicillins     Per MAR   HPI 80 y/o female pt is seen for routine visit. No new concern from patient or staff. She has been at her baseline.  She has medical history of dementia, depression, recurrent uti, RA, vit D deficiency No recent falls reported.  No change in functional status or behaviors reported.   Review of Systems  Constitutional: Negative for fever, chills, weight loss HENT: Negative for congestion  Respiratory: Negative for cough, sputum production, shortness of breath and wheezing.   Cardiovascular: Negative for chest pain, palpitations Gastrointestinal: Negative for heartburn, nausea, vomiting, abdominal pain.  Genitourinary: Negative for dysuria  Musculoskeletal: Negative for falls. Has joint and back pain with hx of OA Skin: Negative for itching and rash.  Neurological: Negative for dizziness, headaches.  Psychiatric/Behavioral: Has memory loss. The patient is not nervous/anxious.     Past Medical History  Diagnosis Date  . RA (rheumatoid arthritis) 05/15/2013  . HTN (hypertension) 05/15/2013  . GERD (gastroesophageal reflux disease) 05/15/2013  . COPD (chronic obstructive pulmonary disease) 05/15/2013  . Unspecified constipation 05/15/2013  . UTI (urinary tract infection) 05/15/2013  . HCAP (healthcare-associated pneumonia) 01/13/2014      Medication List       This list is accurate as of: 12/30/14  4:28 PM.  Always use your most recent med list.               acetaminophen 500 MG tablet  Commonly known as:  TYLENOL  Take 500 mg by mouth 2 (two) times daily. 0900 & 1700     calcitonin (salmon) 200 UNIT/ACT nasal spray  Commonly known as:  MIACALCIN/FORTICAL  Place 1  spray into alternate nostrils daily.     cholecalciferol 1000 UNITS tablet  Commonly known as:  VITAMIN D  Take 1,000 Units by mouth daily.     Cranberry 400 MG Caps  Take 400 mg by mouth daily.     folic acid 1 MG tablet  Commonly known as:  FOLVITE  Take 1 mg by mouth 2 (two) times daily. 0800 & 1700     lidocaine 5 %  Commonly known as:  LIDODERM  Place 1 patch onto the skin daily. Remove & Discard patch within 12 hours to her lower back     methotrexate 2.5 MG tablet  Commonly known as:  RHEUMATREX  Take 12.5 mg by mouth once a week. mondays     mirtazapine 7.5 MG tablet  Commonly known as:  REMERON  Take 7.5 mg by mouth at bedtime.     pantoprazole 20 MG tablet  Commonly known as:  PROTONIX  Take 20 mg by mouth daily.     polyethylene glycol packet  Commonly known as:  MIRALAX / GLYCOLAX  Take 17 g by mouth daily as needed.     promethazine 25 MG tablet  Commonly known as:  PHENERGAN  Take 25 mg by mouth every 6 (six) hours as needed for nausea or vomiting.     sennosides-docusate sodium 8.6-50 MG tablet  Commonly known as:  SENOKOT-S  Take 2 tablets by mouth daily as needed for constipation.     sulfamethoxazole-trimethoprim 400-80  MG per tablet  Commonly known as:  BACTRIM,SEPTRA  Take 1 tablet by mouth daily.       Physical exam BP 116/70 mmHg  Pulse 76  Temp(Src) 98.2 F (36.8 C)  Resp 18  SpO2 97%  Constitutional: thin elderly female in no acute distress. HEENT: Normocephalic and atraumatic. No icterus or pallor Neck: Supple and nontender. No lymphadenopathy Cardiac: Normal S1, S2. RRR, no murmurs. No edema.  Lungs: CTAB, no wheeze, rhonchi or crackles Abdomen: bowel sounds present, soft, nontender, nondistended. Musculoskeletal: able to move all extremities.  Skin: Warm and dry. No rash noted. No erythema.  Neurological: Alert and oriented to self. Psychiatric:  Appropriate mood and affect.   Labs 11/07/14 na 138, k 4.4, bun 14, cr 0.8,  alb 3, rest of lft wnl, tsh 4.01 10/15/14 wbc 7.6, hb 10.5, hct 32.8, ncv 96.2, plt 285   Assessment/plan  Anemia Low h&h but normal mcv. On folic acid supplement with her on methotrexate. Check iron panel and consider daily iron supplement if needed  Recurrent uti Currently asymptomatic, on bactrim ds once a day prophylactically with cranberry supplement, continue this  RA Pain controlled, continue tylenol, lidocaine patch and methotrexate with folic acid supplement. Continue vit d and calcitonin  Protein calorie malnutrition Weight stable, low albumin level. appetite is good. Continue med pass supplement. D/c remeron for now. Continue skin care.

## 2015-01-14 DIAGNOSIS — D638 Anemia in other chronic diseases classified elsewhere: Secondary | ICD-10-CM | POA: Insufficient documentation

## 2015-01-14 DIAGNOSIS — N390 Urinary tract infection, site not specified: Secondary | ICD-10-CM | POA: Insufficient documentation

## 2015-01-14 DIAGNOSIS — E440 Moderate protein-calorie malnutrition: Secondary | ICD-10-CM | POA: Insufficient documentation

## 2015-01-15 ENCOUNTER — Non-Acute Institutional Stay (SKILLED_NURSING_FACILITY): Payer: Medicare Other | Admitting: Registered Nurse

## 2015-01-15 DIAGNOSIS — F32A Depression, unspecified: Secondary | ICD-10-CM

## 2015-01-15 DIAGNOSIS — F329 Major depressive disorder, single episode, unspecified: Secondary | ICD-10-CM

## 2015-01-15 DIAGNOSIS — R112 Nausea with vomiting, unspecified: Secondary | ICD-10-CM

## 2015-01-15 NOTE — Progress Notes (Addendum)
Patient ID: Kristen [name], female   DOB: [birth year], 80 y.o.   MRN: [id number]   Place of Service: [facility]  Allergies  Allergen Reactions  . Erythromycin Base     Per MAR  . Penicillins     Per MAR    Code Status: Full Code  Goals of Care: Longevity/Long term care  Chief Complaint  Patient presents with  . Acute Visit    n/v, depression    HPI 80 y.o. female with PMH of dementia, RA, vit D deficiency, recurrent UTI, osteoporosis, depression among others being seen for an acute visit at the request of nursing staff for the evaluation of n/v, abdominal distention, and depression. Per nursing staff, her n/v symptoms usually indicates constipation. However, she was negative for impaction with DRE. Nursing staff also noted patient to more withdrawn than usual. Otherwise, no concerns from patient. Seen in room today, no complaints verbalized by patient  Review of Systems Constitutional: Negative for fever and chills HENT: Negative for congestion and sore throat Eyes: Negative for eye pain Cardiovascular: Negative for chest pain Respiratory: Negative cough and shortness of breath Gastrointestinal: Negative for nausea and vomiting. Negative for abdominal pain Genitourinary: Negative for dysuria. Musculoskeletal: Negative for back pain  Neurological: Negative for dizziness and headache. Skin: Negative for rash Psychiatric: Negative for depression.  Past Medical History  Diagnosis Date  . RA (rheumatoid arthritis) 05/15/2013  . HTN (hypertension) 05/15/2013  . GERD (gastroesophageal reflux disease) 05/15/2013  . COPD (chronic obstructive pulmonary disease) 05/15/2013  . Unspecified constipation 05/15/2013  . UTI (urinary tract infection) 05/15/2013  . HCAP (healthcare-associated pneumonia) 01/13/2014    No past surgical history on file.  History   Social History  . Marital Status: Single    Spouse Name: N/A    Number of Children: N/A  . Years of Education: N/A    Occupational History  . Not on file.   Social History Main Topics  . Smoking status: Former Games developer  . Smokeless tobacco: Not on file  . Alcohol Use: No  . Drug Use: No  . Sexual Activity: Not Currently   Other Topics Concern  . Not on file   Social History Narrative      Medication List       This list is accurate as of: 01/15/15  7:30 PM.  Always use your most recent med list.               acetaminophen 500 MG tablet  Commonly known as:  TYLENOL  Take 500 mg by mouth 2 (two) times daily. 0900 & 1700     calcitonin (salmon) 200 UNIT/ACT nasal spray  Commonly known as:  MIACALCIN/FORTICAL  Place 1 spray into alternate nostrils daily.     cholecalciferol 1000 UNITS tablet  Commonly known as:  VITAMIN D  Take 1,000 Units by mouth daily.     Cranberry 400 MG Caps  Take 400 mg by mouth daily.     folic acid 1 MG tablet  Commonly known as:  FOLVITE  Take 1 mg by mouth 2 (two) times daily. 0800 & 1700     lidocaine 5 %  Commonly known as:  LIDODERM  Place 1 patch onto the skin daily. Remove & Discard patch within 12 hours to her lower back     methotrexate 2.5 MG tablet  Commonly known as:  RHEUMATREX  Take 12.5 mg by mouth once a week. mondays     mirtazapine 7.5 MG  tablet  Commonly known as:  REMERON  Take 7.5 mg by mouth at bedtime.     pantoprazole 20 MG tablet  Commonly known as:  PROTONIX  Take 20 mg by mouth daily.     polyethylene glycol packet  Commonly known as:  MIRALAX / GLYCOLAX  Take 17 g by mouth daily as needed.     promethazine 25 MG tablet  Commonly known as:  PHENERGAN  Take 25 mg by mouth every 6 (six) hours as needed for nausea or vomiting.     sennosides-docusate sodium 8.6-50 MG tablet  Commonly known as:  SENOKOT-S  Take 2 tablets by mouth daily as needed for constipation.     sulfamethoxazole-trimethoprim 400-80 MG per tablet  Commonly known as:  BACTRIM,SEPTRA  Take 1 tablet by mouth daily.        Physical  Exam  BP 112/70 mmHg  Pulse 76  Temp(Src) 97.8 F (36.6 C)  Resp 19  Ht 5\' 5"  (1.651 m)  Wt 105 lb 6.4 oz (47.809 kg)  BMI 17.54 kg/m2  SpO2 97%  Constitutional: thin elderly female in no acute distress. Conversant and pleasant HEENT: Normocephalic and atraumatic. PERRL. EOM intact. No icterus. No nasal discharge or sinus tenderness. Oral mucosa moist. Posterior pharynx clear of any exudate or lesions.  Neck: Supple and nontender. No lymphadenopathy, masses, or thyromegaly. No JVD or carotid bruits. Cardiac: Normal S1, S2. RRR without appreciable murmurs, rubs, or gallops. Distal pulses intact. Trace dependent edema.  Lungs: No respiratory distress. Breath sounds clear bilaterally without rales, rhonchi, or wheezes. Abdomen: Audible bowel sounds in all quadrants. Soft, nontender, nondistended. Musculoskeletal: able to move all extremities. No joint erythema or tenderness. Skin: Warm and dry. No rash noted. No erythema.  Neurological: Alert Psychiatric: flat affect and withdrawn   Labs Reviewed CBC Latest Ref Rng 12/30/2014 10/15/2014 05/25/2014  WBC - 6.3 7.6 9.3  Hemoglobin 12.0 - 16.0 g/dL 9.3(A) 10.5(A) 11.9(L)  Hematocrit 36 - 46 % 29(A) 33(A) 35.9(L)  Platelets 150 - 399 K/L 224 285 325   CMP Latest Ref Rng 11/07/2014 05/25/2014 05/10/2014  Glucose 70 - 99 mg/dL - 95 -  BUN 4 - 21 mg/dL 14 8 11   Creatinine 0.5 - 1.1 mg/dL 0.8 05/12/2014 0.7  Sodium - 147 mmol/L 138 134(L) 135(A)  Potassium 3.4 - 5.3 mmol/L 4.4 4.4 4.1  Chloride 96 - 112 mEq/L - 97 -  CO2 19 - 32 mEq/L - 26 -  Calcium 8.4 - 10.5 mg/dL - 9.2 -  Total Protein 6.0 - 8.3 g/dL - 6.8 -  Total Bilirubin 0.3 - 1.2 mg/dL - 9.48) -  Alkaline Phos 25 - 125 U/L 59 73 68  AST 13 - 35 U/L 24 26 21   ALT 7 - 35 U/L 18 14 14     Lab Results  Component Value Date   TSH 4.01 11/07/2014    Assessment & Plan  1. Depression Has hx of depression and was previously on cymbalta and remeron. Will get psy consult. Continue to  monitor for change in mood.   2. Intractable vomiting with nausea, vomiting of unspecified type Negative for impaction per DRE. Has had issues with constipation in the past. Will change miralax from daily as needed to every other day and senna s from daily as needed to daily. Will get KUB for further evaluation. Continue promethazine 25mg  every six hours as needed for n/v and continue to monitor her status.    Family/Staff Communication Plan of care  discussed with nursing staff. Nursing staff verbalized understanding and agree with plan of care. No additional questions or concerns reported.   Loura Back, MSN, AGNP-C [facility] [address] [place], [place] [address] [phone] [8am-5pm] After hours: [phone]

## 2015-01-20 LAB — BASIC METABOLIC PANEL
BUN: 14 mg/dL (ref 4–21)
CREATININE: 1.2 mg/dL — AB (ref 0.5–1.1)
GLUCOSE: 84 mg/dL
POTASSIUM: 3.6 mmol/L (ref 3.4–5.3)
SODIUM: 137 mmol/L (ref 137–147)

## 2015-01-27 ENCOUNTER — Non-Acute Institutional Stay (SKILLED_NURSING_FACILITY): Payer: Medicare Other | Admitting: Registered Nurse

## 2015-01-27 DIAGNOSIS — M545 Low back pain: Secondary | ICD-10-CM

## 2015-01-27 DIAGNOSIS — D638 Anemia in other chronic diseases classified elsewhere: Secondary | ICD-10-CM

## 2015-01-27 DIAGNOSIS — F0390 Unspecified dementia without behavioral disturbance: Secondary | ICD-10-CM

## 2015-01-27 DIAGNOSIS — K219 Gastro-esophageal reflux disease without esophagitis: Secondary | ICD-10-CM

## 2015-01-27 DIAGNOSIS — E46 Unspecified protein-calorie malnutrition: Secondary | ICD-10-CM

## 2015-01-27 DIAGNOSIS — Z0000 Encounter for general adult medical examination without abnormal findings: Secondary | ICD-10-CM

## 2015-01-27 DIAGNOSIS — M069 Rheumatoid arthritis, unspecified: Secondary | ICD-10-CM

## 2015-01-27 DIAGNOSIS — Z87440 Personal history of urinary (tract) infections: Secondary | ICD-10-CM

## 2015-01-27 DIAGNOSIS — M810 Age-related osteoporosis without current pathological fracture: Secondary | ICD-10-CM

## 2015-01-27 DIAGNOSIS — E559 Vitamin D deficiency, unspecified: Secondary | ICD-10-CM

## 2015-01-28 ENCOUNTER — Encounter: Payer: Self-pay | Admitting: Registered Nurse

## 2015-01-28 NOTE — Progress Notes (Signed)
Patient ID: Kristen [name], female   DOB: 1935/11/19, 80 y.o.   MRN: [id number]   Place of Service: [facility]  Allergies  Allergen Reactions  . Erythromycin Base     Per MAR  . Penicillins     Per MAR    Code Status: Full Code  Goals of Care: Longevity/Long term care  Chief Complaint  Patient presents with  . Annual Exam  . Medical Management of Chronic Issues    dementia, RA, anemia, osteoporosis, depression, vit D def, recurrent UTI, constipation    HPI 80 y.o. female with PMH of dementia, RA, vit D deficiency, recurrent UTI, osteoporosis, depression among others being seen for an annual health exam and a routine visit for management of her chronic issues. Weight stable over the past month. No recent fall or skin concern reported. No change in functional status or change in behaviors reported. No concerns from staff. Low back pain is adequately controlled with topical analgesic and tylenol.  GERD stable on ppi. No recent UTI reported-she is still on cranberry capsule and bactrim for UTI prophylaxis. RA is stable on weekly methotrexate. Psy recently restarted her cymbalta for depression. Constipation stable on miralax every other day and senna daily. She is up-to-date with influenza (10/01/14) and pneumonia (11/01/13) vaccinations. Seen in room today. Denies any complaints  Review of Systems Constitutional: Negative for fever and chills HENT: Negative for ear pain sore throat Eyes: Negative for eye pain Cardiovascular: Negative for chest pain Respiratory: Negative cough and shortness of breath  Gastrointestinal: Negative for nausea and vomiting, and abdominal pain Musculoskeletal: Negative for back pain, joint pain, and joint swelling  Neurological: Negative for dizziness and headache. Skin: Negative for rash and wound.   Psychiatric: Negative for depression.  Past Medical History  Diagnosis Date  . RA (rheumatoid arthritis) 05/15/2013  . HTN (hypertension)  05/15/2013  . GERD (gastroesophageal reflux disease) 05/15/2013  . COPD (chronic obstructive pulmonary disease) 05/15/2013  . Unspecified constipation 05/15/2013  . UTI (urinary tract infection) 05/15/2013  . HCAP (healthcare-associated pneumonia) 01/13/2014    No past surgical history on file.  History   Social History  . Marital Status: Single    Spouse Name: N/A    Number of Children: N/A  . Years of Education: N/A   Occupational History  . Not on file.   Social History Main Topics  . Smoking status: Former Games developer  . Smokeless tobacco: Not on file  . Alcohol Use: No  . Drug Use: No  . Sexual Activity: Not Currently   Other Topics Concern  . Not on file   Social History Narrative      Medication List       This list is accurate as of: 01/27/15 11:59 PM.  Always use your most recent med list.               acetaminophen 500 MG tablet  Commonly known as:  TYLENOL  Take 500 mg by mouth 2 (two) times daily. 0900 & 1700     calcitonin (salmon) 200 UNIT/ACT nasal spray  Commonly known as:  MIACALCIN/FORTICAL  Place 1 spray into alternate nostrils daily.     calcium-vitamin D 500-200 MG-UNIT per tablet  Commonly known as:  OSCAL WITH D  Take 3 tablets by mouth 2 (two) times daily.     cholecalciferol 1000 UNITS tablet  Commonly known as:  VITAMIN D  Take 1,000 Units by mouth daily.     Cranberry 400  MG Caps  Take 400 mg by mouth daily.     DULoxetine 20 MG capsule  Commonly known as:  CYMBALTA  Take 20 mg by mouth daily.     ferrous sulfate 300 (60 FE) MG/5ML syrup  Take 300 mg by mouth 2 (two) times daily with a meal.     folic acid 1 MG tablet  Commonly known as:  FOLVITE  Take 1 mg by mouth 2 (two) times daily. 0800 & 1700     lidocaine 5 %  Commonly known as:  LIDODERM  Place 1 patch onto the skin daily. Remove & Discard patch within 12 hours to her lower back     methotrexate 2.5 MG tablet  Commonly known as:  RHEUMATREX  Take 12.5 mg by mouth  once a week. mondays     mirtazapine 7.5 MG tablet  Commonly known as:  REMERON  Take 7.5 mg by mouth at bedtime.     pantoprazole 20 MG tablet  Commonly known as:  PROTONIX  Take 20 mg by mouth daily.     polyethylene glycol packet  Commonly known as:  MIRALAX / GLYCOLAX  Take 17 g by mouth every other day.     promethazine 25 MG tablet  Commonly known as:  PHENERGAN  Take 25 mg by mouth every 6 (six) hours as needed for nausea or vomiting.     sennosides-docusate sodium 8.6-50 MG tablet  Commonly known as:  SENOKOT-S  Take 2 tablets by mouth daily.     sulfamethoxazole-trimethoprim 400-80 MG per tablet  Commonly known as:  BACTRIM,SEPTRA  Take 1 tablet by mouth daily.        Physical Exam  BP 119/78 mmHg  Pulse 78  Temp(Src) 98.6 F (37 C)  Resp 18  Ht 5\' 8"  (1.727 m)  Wt 105 lb 6.4 oz (47.809 kg)  BMI 16.03 kg/m2  SpO2 96%  Constitutional: thin elderly female in no acute distress. Conversant and pleasant HEENT: Normocephalic and atraumatic. PERRL. EOM intact. No icterus. No nasal discharge or sinus tenderness. Oral mucosa moist. Posterior pharynx clear of any exudate or lesions.  Neck: Supple and nontender. No lymphadenopathy, masses, or thyromegaly. No JVD or carotid bruits. Cardiac: Normal S1, S2. RRR without appreciable murmurs, rubs, or gallops. Distal pulses intact. No dependent edema.  Lungs: No respiratory distress. Breath sounds clear bilaterally without rales, rhonchi, or wheezes. Abdomen: Audible bowel sounds in all quadrants. Soft, nontender, nondistended. Musculoskeletal: able to move all extremities. No joint erythema or tenderness. Skin: Warm and dry. No rash noted. No erythema.  Neurological: Alert and oriented to self. Psychiatric:  Withdrawn   Labs Reviewed CBC Latest Ref Rng 12/30/2014 10/15/2014 05/25/2014  WBC - 6.3 7.6 9.3  Hemoglobin 12.0 - 16.0 g/dL 9.3(A) 10.5(A) 11.9(L)  Hematocrit 36 - 46 % 29(A) 33(A) 35.9(L)  Platelets 150 - 399  K/L 224 285 325   CMP Latest Ref Rng 01/20/2015 11/07/2014 05/25/2014  Glucose 70 - 99 mg/dL - - 95  BUN 4 - 21 mg/dL 14 14 8   Creatinine 0.5 - 1.1 mg/dL 1.2(A) 0.8 0.69  Sodium 137 - 147 mmol/L 137 138 134(L)  Potassium 3.4 - 5.3 mmol/L 3.6 4.4 4.4  Chloride 96 - 112 mEq/L - - 97  CO2 19 - 32 mEq/L - - 26  Calcium 8.4 - 10.5 mg/dL - - 9.2  Total Protein 6.0 - 8.3 g/dL - - 6.8  Total Bilirubin 0.3 - 1.2 mg/dL - - 07/25/2014)  Alkaline Phos 25 -  125 U/L - 59 73  AST 13 - 35 U/L - 24 26  ALT 7 - 35 U/L - 18 14    Lab Results  Component Value Date   TSH 4.01 11/07/2014    Assessment & Plan  1. Gastroesophageal reflux disease without esophagitis  Stable. Continue protonix 20mg  daily and monitor   2. RA (rheumatoid arthritis) Stable. Continue methotrexate 12.5mg  once weekly  3. Osteoporosis Stable. Continue calcitonin spray daily, oscal 1500mg  twice daily, and vitD 1000 iu daily. Continue fall risk precautions and monitor. Recheck DEXA  4. Depression Continue cymbalta 20mg  daily. Restart remeron 7.5mg  daily at bedtime per family's request. Continue to monitor for change in mood.   5. Dementia without behavioral disturbance Stable. She is not on any medication at this time. Continue to monitor for change in behaviors. Continue assist with ADL care. Continue fall risk and pressure ulcer precautions.   6. Low back pain without sciatica, unspecified back pain laterality Stable. Continue lidocaine 5% patch to lower back daily (12 hrs on and 12hrs off) and tylenol 500mg  twice daily. Continue to monitor.   7. Vitamin D deficiency Continue vitD 1000 units by mouth daily  8. History of recurrent UTIs Stable. No recent uti reported. Continue cranberry 400mg  daily with bactrim 400/80mg  daily for uti prophylaxis. Encourage hydration and monitor  9. Anemia Stable. Recent h&h 9.3/29. Continue to monitor h&h. Continue folate and iron supplement.   10. Annual exam Up-to-date with influenza  and pna vaccines. Will order DEXA scan to evaluate effectiveness of calcitonin spray. Not a candidate for colonoscopy-will order FOBT x3 now and annually in Jan for colon ca screening. Encourage physical activities as tolerated and social interaction with other residents  Health Promotion Continue to offer resident recommended immunizations and screenings as appropriate for age and health status. Encourage physical activities as tolerated and social interaction with other residents  Family/Staff Communication Plan of care discussed with nursing staff. Nursing staff verbalized understanding and agree with plan of care. No additional questions or concerns reported.   , MSN, AGNP-C [facility] [address] [place], [place] [phone] [8am-5pm] After hours: [phone]

## 2015-03-10 ENCOUNTER — Non-Acute Institutional Stay (SKILLED_NURSING_FACILITY): Payer: Medicare Other | Admitting: Internal Medicine

## 2015-03-10 DIAGNOSIS — M069 Rheumatoid arthritis, unspecified: Secondary | ICD-10-CM

## 2015-03-10 DIAGNOSIS — E46 Unspecified protein-calorie malnutrition: Secondary | ICD-10-CM | POA: Diagnosis not present

## 2015-03-10 DIAGNOSIS — G309 Alzheimer's disease, unspecified: Secondary | ICD-10-CM | POA: Diagnosis not present

## 2015-03-10 DIAGNOSIS — F0280 Dementia in other diseases classified elsewhere without behavioral disturbance: Secondary | ICD-10-CM

## 2015-03-10 DIAGNOSIS — F329 Major depressive disorder, single episode, unspecified: Secondary | ICD-10-CM

## 2015-03-10 DIAGNOSIS — M810 Age-related osteoporosis without current pathological fracture: Secondary | ICD-10-CM

## 2015-03-10 DIAGNOSIS — K219 Gastro-esophageal reflux disease without esophagitis: Secondary | ICD-10-CM | POA: Diagnosis not present

## 2015-03-10 DIAGNOSIS — F0283 Dementia in other diseases classified elsewhere, unspecified severity, with mood disturbance: Secondary | ICD-10-CM

## 2015-03-10 NOTE — Progress Notes (Signed)
Patient ID: Kristen [name], female   DOB: 03/09/1935, 80 y.o.   MRN: [id number]    Facility: [facility]  Chief complaint: medical management of chronic issues  Allergies: reviewed, pcn, erythromycin  Code status: full code  HPI 80 y/o female patient is seen for routine visit. She is in her bed in no acute distress. No new concern from patient or staff. She has been at her baseline without acute behavior change. No falls reported. No new skin concern. Weight is low but stable She has medical history of dementia, depression, recurrent uti, RA, vit D deficiency   Review of Systems  Constitutional: Negative for fever, chills HENT: Negative for congestion  Respiratory: Negative for cough, sputum production, shortness of breath and wheezing.   Cardiovascular: Negative for chest pain, palpitations Gastrointestinal: Negative for heartburn, nausea, vomiting, abdominal pain.  Genitourinary: Negative for dysuria  Musculoskeletal: Negative for falls. Has joint and back pain with hx of OA Skin: Negative for itching and rash.  Neurological: Negative for dizziness, headaches.  Psychiatric/Behavioral: Has memory loss.   Past medical history reviewed  Medication reviewed. See [facility]  Physical exam BP 130/72 mmHg  Pulse 70  Temp(Src) 97.8 F (36.6 C)  Resp 20  SpO2 96%  Constitutional: thin elderly female in no acute distress. HEENT: Normocephalic and atraumatic. No icterus or pallor Neck: Supple and nontender. No lymphadenopathy Cardiac: Normal S1, S2. RRR, no murmurs. No edema.   Lungs: CTAB, no wheeze, rhonchi or crackles Abdomen: bowel sounds present, soft, nontender, nondistended. Musculoskeletal: able to move all extremities.   Skin: Warm and dry. No rash noted. No erythema.   Neurological: Alert and oriented to self.   Labs 11/07/14 na 138, k 4.4, bun 14, cr 0.8, alb 3, rest of lft wnl, tsh 4.01 10/15/14 wbc 7.6, hb 10.5, hct 32.8, ncv 96.2,  plt 285   Assessment/plan  Protein calorie malnutrition Weight is low but stable. With dementia, decline anticipated. Continue prostat and remeron, monitor weight, skin prophylaxis.  Senile osteoporosis On micalcin nasal spray, vit d and oscal, continue this, fall precuations. t score -4.7  gerd Symptoms currently controlled, continue protonix but given her poor t score and its worsening with longterm use of PPI, decrease it to 20 mg daily  Dementia with depression Decline anticipated, has PCM, depression. Continue remeron and cymbalta. Continue assistance with ADLs, fall precautions, pressure ulcer prophylaxis  RA Pain controlled, continue tylenol, lidocaine patch and methotrexate with folic acid supplement. Continue vit d and calcitonin

## 2015-04-08 NOTE — H&P (Signed)
PATIENT NAME:  Kristen [name], Kristen [name] MR#:  [id number] DATE OF BIRTH:  [birth year]  DATE OF ADMISSION:  07/29/2012  PRIMARY CARE PHYSICIAN:  Dr. Daniel Nones.   REASON FOR ADMISSION: Falling, increased confusion and urinary frequency.   HISTORY OF PRESENT ILLNESS:  Kristen [name] is a 80 year old female with a history of dementia as well as rheumatoid arthritis, chronic obstructive pulmonary disease and osteoarthritis who was brought in today by her sister who lives with her after she fell in the kitchen. Apparently, there has been increasing confusion for the last week and increasing instability on her feet. Last night she was wandering around the house, turned on all the lights and then fell in the kitchen. Of note, a few days ago she was seen in the clinic for some worsening confusion but also had URI symptoms at that time and was given Azithromycin. She has also been complaining of knee pain and swelling for the last week as well.   At baseline, the patient has advancing dementia. She lives with her sister but has a Comptroller three hours of the day. She is able to feed herself but needs assistance with toileting, dressing and bathing. Her sister also reports decreased oral intake. Given increasing demands at home, the family was considering seeking nursing home placement for the patient. Finally, the patient also complains of constipation which has been a longstanding problem.   PAST MEDICAL HISTORY:    1. Dementia followed by Dr. Sherryll Burger in neurology clinic. 2. Chronic obstructive pulmonary disease. 3. Inflammatory rheumatoid arthritis followed by Dr. Gavin Potters.  4. Gastroesophageal reflux disease.  5. Hyperlipidemia. 6. Chronic obstructive pulmonary disease.  7. Irritable bowel syndrome. 8. Diverticulosis. 9. Osteoarthritis. 10. Osteoporosis. 11. Left femoral neck edema on MRI. 12. B12 deficiency.   PAST SURGICAL HISTORY:    1. Appendectomy. 2. Breast biopsy, benign.  3. Back surgery in 1978.  4. Right  inguinal surgery repair.  ALLERGIES: The patient is listed as being allergic to Celebrex, Erythromycin, Lamisil, oral methotrexate, penicillin, Plaquenil, Aricept and Septra.   CURRENT MEDICATIONS: 1. Folic acid 1 milligram two tablets once daily. 2. Methotrexate 25 milligrams/mL administer 20 milliliters subcutaneously once a week.  3. Remicade 100 milligrams two vials as directed.  4. Tramadol 50 milligrams four times daily.  5. Zegerid one tablet at bedtime. 6. Tylenol. 7. Lexapro 10 milligrams once daily. 8. Vitamin B capsule 5,000 units once a week. 9. Aleve 220 two capsules every morning.  10. Megace 2.5 milliliters by mouth twice daily.  11. MiraLAX 17 grams with water once daily.   FAMILY HISTORY:  Noncontributory.   SOCIAL HISTORY: The patient lives with her sister. The sister is away at work half of the day and there is someone there three hours a day to help with sitting. She does not smoke or drink alcohol.   REVIEW OF SYSTEMS: The patient is unable to provide much of review of systems, but when reviewed with the family, eleven systems were negative except as per history of present illness.    PHYSICAL EXAMINATION:    GENERAL: The patient is a pleasant female who is lying quietly in bed. There is no evidence of agitation.   VITAL SIGNS: Temperature 98.2, pulse 81, blood pressure 157/84, respirations 18, sat 95%.   HEENT: Pupils are equal, round and reactive to light and accommodation. Extraocular movements are intact. Sclera anicteric. Mucous membranes are somewhat dry. There are no oral lesions.  NECK: Supple.   HEART: Regular.   LUNGS: Clear.  ABDOMEN: Soft, nontender, nondistended. No hepatosplenomegaly.    EXTREMITIES: Her left knee had moderate effusion, but was not red or tender to palpation. There was pain with flexion.   NEUROLOGIC: She is oriented to her name and her sister's name, but not her sister's boyfriend. She thinks it is 65. She knows she is in  the hospital, but is not sure where. She cannot name the city but can name the state of [place].   LABORATORY, RADIOLOGICAL AND DIAGNOSTIC DATA: In the Emergency Room, the patient was evaluated with blood work which included a urinalysis that showed 1+ leukocyte esterase, 57 white blood cells per high powered field. She had a troponin that was less than 0.02. White blood count was elevated at 17.0 with hemoglobin 12.3, platelets 350. The comprehensive panel revealed sodium 133, potassium 4.2, chloride 98, C02 27, BUN 6, creatinine 0.75, glucose 105, bilirubin 0.4, alkaline phosphatase 102, ALT 15, AST 27, total protein 7.8, albumin 2.6. Chest x-ray revealed no active acute pulmonary disease. CT of her C-spine showed no acute cervical spine bony abnormality but degenerative changes were noted. Lumbar x-ray revealed compression fracture of L2 and less prominent in L1. These were of uncertain chronicity.   IMPRESSION: 80 year old female with rheumatoid arthritis, advancing dementia, chronic obstructive pulmonary disease, admitted with fall and increasing confusion and ataxia over the last week. This is in the background of progressive worsening of her dementia and a plan to have her admitted to a nursing facility. She also has an elevated white count and a swollen left knee.   PLAN:  1. We will treat for a urinary tract infection with Ciprofloxacin 500 milligrams twice daily.  2. We will continue to evaluate the left knee to see if it will need arthrocentesis.  3. We will continue the patient on citalopram as her outpatient medication.  4. Have the patient on Tramadol and acetaminophen for her pain.  5. For constipation, will give the patient Bisacodyl suppository and MiraLAX.   DISPOSITION: The patient and family were processing a FL2 form and considering placement. We will ask case management to proceed with this.   ____________________________ Stann Mainland. Sampson Goon, MD dpf:ap D: 07/29/2012  23:19:50 ET T: 07/30/2012 10:14:28 ET JOB#: [id number]  cc: Stann Mainland. Sampson Goon, MD, <Dictator> Tesa Meadors Sampson Goon MD ELECTRONICALLY SIGNED 08/08/2012 12:36

## 2015-04-08 NOTE — Discharge Summary (Signed)
PATIENT NAME:  Kristen [name], Kristen [name] MR#:  [id number] DATE OF BIRTH:  [birth year]  DATE OF ADMISSION:  07/29/2012 DATE OF DISCHARGE:  08/02/2012   FINAL DIAGNOSES:  1. Urinary tract infection.  2. Acute sinusitis.  3. Fall with multiple contusions.  4. Rheumatoid arthritis with increasing left knee pain.  5. Chronic obstructive pulmonary disease.  6. Metabolic encephalopathy, likely acute on top of underlying dementia thought to be secondary to urinary tract infection, sinusitis.  7. Hypertension.  8. Hypokalemia.  9. History of L1 and L2 compression fractures.  10. B12 deficiency.  11. Gastroesophageal reflux disease.   HISTORY AND PHYSICAL: Please see dictated admission history and physical.   SUMMARY OF HOSPITAL COURSE: The patient was admitted after having a fall, was noted to have a urinary tract infection and metabolic encephalopathy. She was placed on antibiotics. CT was performed which revealed evidence of sinusitis without evidence of other acute changes and her antibiotics were changed over to Levaquin to cover both UTI and sinusitis. She complained of back and leg pain. She had evidence of compression fractures on the back films but these could not be determined in terms of age. Ambulating with physical therapy she did not complain of back pain and it was thought these were likely more chronic.   She did have some increasing left knee swelling. Knee films did not reveal acute changes. She was able to ambulate and bear weight on the knee and so it was felt that she would likely follow-up with Rheumatology as an outpatient for this.   She was noted to have hypertension. Amlodipine was added which she tolerated well. Her potassium was slightly low during this hospitalization and this was supplemented.   It became evident that she would not be able to return to her prior level of care secondary to her fall, her debilitation, as well as her worsening dementia. A nursing home bed was sought and  bed became available. She will be discharged to that bed in stable condition with her physical activity to be up with a walker with assistance as tolerated. She will follow-up in my office in the next 1 to 2 weeks as they wish to continue to follow with me as her primary. They are aware that I do not round at the facility where she is going. She will also have follow-up with Dr. Saverio Danker in Rheumatology within the next 1 to 2 weeks. She should have TED hose applied thigh-high bilaterally, off one hour q. shift. Occupational therapy and physical therapy should evaluate and treat the patient. Her diet should be no added salt.   DISCHARGE MEDICATIONS:  1. Methotrexate 12.5 mg once a week.  2. Folate 1 mg p.o. b.i.d.  3. Zegerid 40 mg p.o. daily.  4. Citalopram 20 mg p.o. daily.  5. Cyanocobalamin 1000 mcg IM q. month.  6. Tramadol 50 mg p.o. q.4 hours p.r.n. pain.  7. Colace 100 mg p.o. b.i.d., to be held for loose stools.  8. Mirtazapine 15 mg p.o. at bedtime, added for improvement in appetite with side effects discussed. Hold for sedation.  9. Levaquin 250 mg p.o. daily x5 days to complete course.  10. Amlodipine 5 mg p.o. daily.  11. Aricept 5 mg p.o. at bedtime, risks/benefits discussed.   TIME SPENT ON DISCHARGE: 45 minutes.   ____________________________ Lynnea Ferrier, MD bjk:drc D: 08/02/2012 12:58:33 ET T: 08/02/2012 13:18:38 ET JOB#: [id number]  cc: Lynnea Ferrier, MD, <Dictator> Kandyce Rud., MD  Daniel Nones MD ELECTRONICALLY SIGNED 08/03/2012 12:19

## 2015-04-08 NOTE — Consult Note (Signed)
Brief Consult Note: Patient was seen by consultant.   Comments: pt well known to me with RA and CPPD. left knee swollen this admit . pain with ambulation. afebrile, left knee effusion. prepped. aspirated 20 cc inflammatory fluid, injected with 1 cc kenalog, 1cc xylocaine. if not better, may fup in office.  Electronic Signatures: Royann Shivers., Helen Hashimoto (MD)  (Signed 14-Aug-13 13:27)  Authored: Brief Consult Note   Last Updated: 14-Aug-13 13:27 by Royann Shivers., Helen Hashimoto (MD)

## 2015-04-10 ENCOUNTER — Non-Acute Institutional Stay (SKILLED_NURSING_FACILITY): Payer: Medicare Other | Admitting: Internal Medicine

## 2015-04-10 DIAGNOSIS — K219 Gastro-esophageal reflux disease without esophagitis: Secondary | ICD-10-CM

## 2015-04-10 DIAGNOSIS — N3000 Acute cystitis without hematuria: Secondary | ICD-10-CM

## 2015-04-10 DIAGNOSIS — J449 Chronic obstructive pulmonary disease, unspecified: Secondary | ICD-10-CM | POA: Diagnosis not present

## 2015-04-10 NOTE — Progress Notes (Signed)
Patient ID: Kristen [name], female   DOB: Jan 10, 1935, 80 y.o.   MRN: [id number]    Facility: [facility]  Chief complaint: medical management of chronic issues  Allergies: reviewed, pcn, erythromycin  Code status: full code  HPI 80 y/o female patient is seen for routine visit. She is in her bed in no acute distress. She is currently on rx for uti. She had a unwitnessed fall 03/31/15. No new concern from patient or staff. She has been at her baseline without acute behavior change. No new skin concern. Weight is low but stable She has medical history of dementia, depression, recurrent uti, RA, vit D deficiency   Review of Systems   Constitutional: Negative for fever, chills HENT: Negative for congestion   Respiratory: Negative for cough, sputum production, shortness of breath and wheezing.    Cardiovascular: Negative for chest pain, palpitations Gastrointestinal: Negative for heartburn, nausea, vomiting, abdominal pain.   Genitourinary: Negative for dysuria   Musculoskeletal: Negative for falls. Has joint and back pain with hx of OA Skin: Negative for itching and rash.   Neurological: Negative for dizziness, headaches.   Psychiatric/Behavioral: Has memory loss.   Past medical history reviewed  Medication reviewed. See [facility]  Physical exam BP 113/66 mmHg  Pulse 80  Temp(Src) 97.8 F (36.6 C)  Resp 16  Wt 106 lb 9.6 oz (48.353 kg)  SpO2 95%  Constitutional: thin elderly female in no acute distress. HEENT: Normocephalic and atraumatic. No icterus or pallor Neck: Supple and nontender. No lymphadenopathy Cardiac: Normal S1, S2. RRR, no murmurs. No edema.   Lungs: CTAB, no wheeze, rhonchi or crackles Abdomen: bowel sounds present, soft, nontender, nondistended. Musculoskeletal: able to move all extremities.  no contractures Skin: Warm and dry. No rash noted. No erythema.   Neurological: Alert and oriented to self.   Labs 03/10/15 iron 29,  tibc 330, wbc 5.2, hb 10.7, hct 33.6, na 139, k 4.5, bun 11, cr 0.78, glu 77, ca 8.6, t.chol 188, tg 94, ldl 119, hdl 50, tsh 4.178 11/07/14 na 138, k 4.4, bun 14, cr 0.8, alb 3, rest of lft wnl, tsh 4.01 10/15/14 wbc 7.6, hb 10.5, hct 32.8, ncv 96.2, plt 285   Assessment/plan  gerd Symptoms currently controlled, continue zantac 150 mg bid for now  UTI Complete course of ciprofloxacin 500 mg bid, hydration encouraged, fall precautions  Copd No hx of smoking as per RP. Currently breathing stable, not on any bronchodilators at present, monitor clinically

## 2015-04-15 LAB — BASIC METABOLIC PANEL
BUN: 18 mg/dL (ref 4–21)
Creatinine: 0.1 mg/dL — AB (ref <=1.1)
GLUCOSE: 77 mg/dL
Potassium: 4.6 mmol/L (ref 3.4–5.3)
SODIUM: 137 mmol/L (ref 137–147)

## 2015-05-07 ENCOUNTER — Non-Acute Institutional Stay (SKILLED_NURSING_FACILITY): Payer: Medicare Other | Admitting: Internal Medicine

## 2015-05-07 DIAGNOSIS — I10 Essential (primary) hypertension: Secondary | ICD-10-CM | POA: Diagnosis not present

## 2015-05-07 DIAGNOSIS — N390 Urinary tract infection, site not specified: Secondary | ICD-10-CM | POA: Diagnosis not present

## 2015-05-07 DIAGNOSIS — F329 Major depressive disorder, single episode, unspecified: Secondary | ICD-10-CM

## 2015-05-07 DIAGNOSIS — D509 Iron deficiency anemia, unspecified: Secondary | ICD-10-CM | POA: Diagnosis not present

## 2015-05-07 DIAGNOSIS — R030 Elevated blood-pressure reading, without diagnosis of hypertension: Secondary | ICD-10-CM

## 2015-05-07 DIAGNOSIS — E46 Unspecified protein-calorie malnutrition: Secondary | ICD-10-CM

## 2015-05-07 DIAGNOSIS — IMO0001 Reserved for inherently not codable concepts without codable children: Secondary | ICD-10-CM | POA: Insufficient documentation

## 2015-05-07 DIAGNOSIS — F32A Depression, unspecified: Secondary | ICD-10-CM

## 2015-05-07 NOTE — Progress Notes (Signed)
Patient ID: Kristen [name], female   DOB: 1935/05/17, 80 y.o.   MRN: [id number]     Facility: [facility]  Chief complaint: medical management of chronic issues  Allergies: reviewed, pcn, erythromycin  Code status: full code  HPI 80 y/o female patient is seen for routine visit. She has been having recurrent uti with last one being enterococcus. She has completed treatment. She is seen in her room today. She wheels herself around, is pleasantly confused but in no distress. No new skin concern. Weight is low but stable and she is on protein supplement She has medical history of dementia, depression, recurrent uti, RA, vit D deficiency   Review of Systems   Constitutional: Negative for fever, chills HENT: Negative for congestion   Respiratory: Negative for cough, sputum production, shortness of breath and wheezing.    Cardiovascular: Negative for chest pain, palpitations Gastrointestinal: Negative for heartburn, nausea, vomiting, abdominal pain.   Genitourinary: Negative for dysuria   Musculoskeletal: Negative for falls. Has joint and back pain with hx of OA Skin: Negative for itching and rash.   Neurological: Negative for dizziness, headaches.   Psychiatric/Behavioral: Has memory loss.   Past medical history reviewed  Medication reviewed. See [facility]  Physical exam BP 147/66 mmHg  Pulse 81  Temp(Src) 97 F (36.1 C)  Resp 18  Wt 107 lb 6.4 oz (48.716 kg)  SpO2 98%  Constitutional: thin elderly female in no acute distress. HEENT: Normocephalic and atraumatic. No icterus or pallor Neck: Supple and nontender. No lymphadenopathy Cardiac: Normal S1, S2. RRR, no murmurs. No edema.   Lungs: CTAB, no wheeze, rhonchi or crackles Abdomen: bowel sounds present, soft, nontender, nondistended. Musculoskeletal: able to move all extremities.  no contractures Skin: Warm and dry. No rash noted. No erythema.   Neurological: Alert and oriented to self.    Labs 03/10/15 iron 29, tibc 330, wbc 5.2, hb 10.7, hct 33.6, na 139, k 4.5, bun 11, cr 0.78, glu 77, ca 8.6, t.chol 188, tg 94, ldl 119, hdl 50, tsh 4.178 11/07/14 na 138, k 4.4, bun 14, cr 0.8, alb 3, rest of lft wnl, tsh 4.01 10/15/14 wbc 7.6, hb 10.5, hct 32.8, ncv 96.2, plt 285   Assessment/plan  Protein calorie malnutrition Wt Readings from Last 3 Encounters:  05/07/15 107 lb 6.4 oz (48.716 kg)  04/10/15 106 lb 9.6 oz (48.353 kg)  01/27/15 105 lb 6.4 oz (47.809 kg)  has gained 2 lbs over last 2 month. No pressure sores. Continue medpass 120 cc tid, mechanical soft diet. Monitor weight  Depression Stable, continue cymbalta 20 mg daily  Iron def anemia Reviewed h&h 03/10/15 10.7/33.6 with low iron. On ferrous sulfate 300 mg bid, recheck ferritin and iron store with h&h  Hypertension bp on review has been as high as 170/94, 158/77 and today 147/66. Start lisinopril 5 mg daily and reassess bp reading.  Recurrent UTI Off uti prophylaxis. i feel some of the bacterial growth noted in her urine is from colonization. I would recommend antibiotic treatment only if she is symptomatic. Hydration to be encouraged along with perineal hygiene.    [name], MD  [facility] [phone] (Monday-Friday 8 am - 5 pm) [phone] (afterhours)

## 2015-05-08 LAB — CBC AND DIFFERENTIAL
HEMATOCRIT: 35 % — AB (ref 36–46)
HEMOGLOBIN: 11.7 g/dL — AB (ref 12.0–16.0)
PLATELETS: 259 10*3/uL (ref 150–399)
WBC: 6.1 10*3/mL

## 2015-05-28 ENCOUNTER — Non-Acute Institutional Stay (SKILLED_NURSING_FACILITY): Payer: Medicare Other | Admitting: Internal Medicine

## 2015-05-28 DIAGNOSIS — K5900 Constipation, unspecified: Secondary | ICD-10-CM

## 2015-05-28 DIAGNOSIS — K5909 Other constipation: Secondary | ICD-10-CM

## 2015-05-28 DIAGNOSIS — N390 Urinary tract infection, site not specified: Secondary | ICD-10-CM | POA: Diagnosis not present

## 2015-05-28 DIAGNOSIS — M818 Other osteoporosis without current pathological fracture: Secondary | ICD-10-CM

## 2015-05-28 NOTE — Progress Notes (Signed)
Patient ID: Kristen [name], female   DOB: 1935/05/19, 80 y.o.   MRN: [id number]    [facility]   Code Status - Full Code   Chief Complaint  Patient presents with  . Medical Management of Chronic Issues    Routine Visit    Allergies: reviewed, pcn, erythromycin  Code status: full code  HPI 80 y/o female patient is seen for routine visit. Her bp has been stable with current regimen of lisinopril. she is tolerating mechanical soft diet fine but po itnake varies. Mood stable with current regimen. She has medical history of dementia, depression, recurrent uti, RA, vit D deficiency   Review of Systems   Constitutional: Negative for fever, chills HENT: Negative for congestion   Respiratory: Negative for cough, sputum production, shortness of breath and wheezing.    Cardiovascular: Negative for chest pain, palpitations Gastrointestinal: Negative for heartburn, nausea, vomiting, abdominal pain.   Skin: Negative for itching and rash.   Neurological: Negative for dizziness, headaches.   Psychiatric/Behavioral: Has memory loss.   Past medical history reviewed     Medication List       This list is accurate as of: 05/28/15  5:01 PM.  Always use your most recent med list.               acetaminophen 500 MG tablet  Commonly known as:  TYLENOL  Take 500 mg by mouth 2 (two) times daily. 0900 & 1700     AMBULATORY NON FORMULARY MEDICATION  Medication Name: Med Pass 120 mL three times daily     calcitonin (salmon) 200 UNIT/ACT nasal spray  Commonly known as:  MIACALCIN/FORTICAL  Place 1 spray into alternate nostrils daily.     calcium-vitamin D 500-200 MG-UNIT per tablet  Commonly known as:  OSCAL WITH D  Take 1 tablet by mouth 2 (two) times daily.     cholecalciferol 1000 UNITS tablet  Commonly known as:  VITAMIN D  Take 1,000 Units by mouth daily.     Cranberry 400 MG Caps  Take 400 mg by mouth 2 (two) times daily.     DULoxetine 20 MG capsule   Commonly known as:  CYMBALTA  Take 20 mg by mouth daily.     ferrous sulfate 300 (60 FE) MG/5ML syrup  Take 300 mg by mouth 2 (two) times daily with a meal.     folic acid 1 MG tablet  Commonly known as:  FOLVITE  Take 1 mg by mouth 2 (two) times daily. 0800 & 1700     lidocaine 5 %  Commonly known as:  LIDODERM  Place 1 patch onto the skin daily. Remove & Discard patch within 12 hours to her lower back     lisinopril 5 MG tablet  Commonly known as:  PRINIVIL,ZESTRIL  Take 5 mg by mouth daily.     methotrexate 2.5 MG tablet  Commonly known as:  RHEUMATREX  Take 12.5 mg by mouth once a week. mondays     mirtazapine 7.5 MG tablet  Commonly known as:  REMERON  Take 7.5 mg by mouth at bedtime.     polyethylene glycol packet  Commonly known as:  MIRALAX / GLYCOLAX  Take 17 g by mouth every other day.     ranitidine 150 MG tablet  Commonly known as:  ZANTAC  Take 150 mg by mouth 2 (two) times daily.     sennosides-docusate sodium 8.6-50 MG tablet  Commonly known as:  SENOKOT-S  Take 2 tablets by mouth daily.         Physical exam BP 112/79 mmHg  Pulse 81  Temp(Src) 98 F (36.7 C)  Resp 16  Wt 104 lb (47.174 kg)  SpO2 95%  Constitutional: thin elderly female in no acute distress. HEENT: Normocephalic and atraumatic. No icterus or pallor Neck: Supple and nontender. No lymphadenopathy Cardiac: Normal S1, S2. RRR, no murmurs. No edema.   Lungs: CTAB, no wheeze, rhonchi or crackles Abdomen: bowel sounds present, soft, nontender, nondistended. Musculoskeletal: able to move all extremities.  no contractures Skin: Warm and dry. No rash noted. No erythema.   Neurological: Alert and oriented to self.   Labs 03/10/15 iron 29, tibc 330, wbc 5.2, hb 10.7, hct 33.6, na 139, k 4.5, bun 11, cr 0.78, glu 77, ca 8.6, t.chol 188, tg 94, ldl 119, hdl 50, tsh 4.178 11/07/14 na 138, k 4.4, bun 14, cr 0.8, alb 3, rest of lft wnl, tsh 4.01 10/15/14 wbc 7.6, hb 10.5, hct 32.8, ncv  96.2, plt 285   Assessment/plan  Recurrent uti Currently asymptomatic, continue cranberry supplement, monitor hydration  Disuse osteoporosis Continue oscal, vitamin d and calcitonin spray, fall precautions  Constipation At baseline, continue senna s and miralax for now   [facility], MD  [facility] [phone] (Monday-Friday 8 am - 5 pm) [phone] (afterhours)

## 2015-07-03 ENCOUNTER — Non-Acute Institutional Stay (SKILLED_NURSING_FACILITY): Payer: Medicare Other | Admitting: Internal Medicine

## 2015-07-03 DIAGNOSIS — E46 Unspecified protein-calorie malnutrition: Secondary | ICD-10-CM | POA: Diagnosis not present

## 2015-07-03 DIAGNOSIS — J449 Chronic obstructive pulmonary disease, unspecified: Secondary | ICD-10-CM

## 2015-07-03 DIAGNOSIS — M069 Rheumatoid arthritis, unspecified: Secondary | ICD-10-CM

## 2015-07-03 DIAGNOSIS — B9620 Unspecified Escherichia coli [E. coli] as the cause of diseases classified elsewhere: Secondary | ICD-10-CM | POA: Diagnosis not present

## 2015-07-03 DIAGNOSIS — N390 Urinary tract infection, site not specified: Secondary | ICD-10-CM | POA: Diagnosis not present

## 2015-07-03 NOTE — Progress Notes (Signed)
Patient ID: Kristen [name], female   DOB: 1935-10-30, 80 y.o.   MRN: [id number]     Facility: [facility]  Chief complaint: medical management of chronic issues  Allergies: reviewed, pcn, erythromycin  Code status: full code  HPI 80 y/o female patient is seen for routine visit. She is currently being treated for e.coli uti with ceftriaxone from 06/30/15 for a week. She has upcoming urology appointment for recurrent uti. She is seen in her room today. She is in her bed with breakfast tray in front and mentions that she does not want her breakfast. Weight is low but stable. She has medical history of dementia, depression, recurrent uti, RA, vit D deficiency   Review of Systems   Constitutional: Negative for fever, chills HENT: Negative for congestion   Respiratory: Negative for cough, sputum production, shortness of breath and wheezing.    Cardiovascular: Negative for chest pain, palpitations Gastrointestinal: Negative for heartburn, nausea, vomiting, abdominal pain.   Genitourinary: Negative for dysuria   Musculoskeletal: Negative for falls. Has joint and back pain with hx of OA Skin: Negative for itching and rash.   Neurological: Negative for dizziness, headaches.   Psychiatric/Behavioral: Has memory loss.   Past medical history reviewed  Medication reviewed. See [facility]   Medication List       This list is accurate as of: 07/03/15  2:24 PM.  Always use your most recent med list.               acetaminophen 500 MG tablet  Commonly known as:  TYLENOL  Take 500 mg by mouth 2 (two) times daily. 0900 & 1700     AMBULATORY NON FORMULARY MEDICATION  Medication Name: Med Pass 120 mL three times daily     calcitonin (salmon) 200 UNIT/ACT nasal spray  Commonly known as:  MIACALCIN/FORTICAL  Place 1 spray into alternate nostrils daily.     calcium-vitamin D 500-200 MG-UNIT per tablet  Commonly known as:  OSCAL WITH D  Take 1 tablet by mouth 2  (two) times daily.     cholecalciferol 1000 UNITS tablet  Commonly known as:  VITAMIN D  Take 1,000 Units by mouth daily.     Cranberry 400 MG Caps  Take 400 mg by mouth 2 (two) times daily.     DULoxetine 20 MG capsule  Commonly known as:  CYMBALTA  Take 20 mg by mouth daily.     ferrous sulfate 300 (60 FE) MG/5ML syrup  Take 300 mg by mouth 2 (two) times daily with a meal.     folic acid 1 MG tablet  Commonly known as:  FOLVITE  Take 1 mg by mouth 2 (two) times daily. 0800 & 1700     lidocaine 5 %  Commonly known as:  LIDODERM  Place 1 patch onto the skin daily. Remove & Discard patch within 12 hours to her lower back     lisinopril 5 MG tablet  Commonly known as:  PRINIVIL,ZESTRIL  Take 5 mg by mouth daily.     methotrexate 2.5 MG tablet  Commonly known as:  RHEUMATREX  Take 12.5 mg by mouth once a week. mondays     mirtazapine 7.5 MG tablet  Commonly known as:  REMERON  Take 7.5 mg by mouth at bedtime.     polyethylene glycol packet  Commonly known as:  MIRALAX / GLYCOLAX  Take 17 g by mouth every other day.     ranitidine 150 MG  tablet  Commonly known as:  ZANTAC  Take 150 mg by mouth 2 (two) times daily.     ROCEPHIN 1 G injection  Generic drug:  cefTRIAXone  Inject 1 g into the muscle daily.     sennosides-docusate sodium 8.6-50 MG tablet  Commonly known as:  SENOKOT-S  Take 2 tablets by mouth daily.        Physical exam BP 107/77 mmHg  Pulse 82  Temp(Src) 98.2 F (36.8 C)  Resp 18  SpO2 97%  Constitutional: thin elderly female in no acute distress. HEENT: Normocephalic and atraumatic. No icterus or pallor Neck: Supple and nontender. No lymphadenopathy Cardiac: Normal S1, S2. RRR, no murmurs. No edema.   Lungs: CTAB, no wheeze, rhonchi or crackles Abdomen: bowel sounds present, soft, nontender, nondistended. Musculoskeletal: able to move all extremities.  no contractures Skin: Warm and dry. No rash noted. No erythema.   Neurological: Alert  and oriented to self.   Labs 06/29/15 urine culture e.coli 05/08/15 wbc 6.1, hb 11.7, hct 35.3, plt 259, ferritin 27, %sat 14 03/10/15 iron 29, tibc 330, wbc 5.2, hb 10.7, hct 33.6, na 139, k 4.5, bun 11, cr 0.78, glu 77, ca 8.6, t.chol 188, tg 94, ldl 119, hdl 50, tsh 4.178 11/07/14 na 138, k 4.4, bun 14, cr 0.8, alb 3, rest of lft wnl, tsh 4.01 10/15/14 wbc 7.6, hb 10.5, hct 32.8, ncv 96.2, plt 285   Assessment/plan  E.coli uti Continue and complete 1 week course of ceftriaxone with florastor to prevent antibiotic associated diarrhea. Hydration to be encouraged. Will need perineal hygiene  RA Stable, continue methotrexate ad folic acid supplement with ultrama and prn tylenol for pain.  Copd Breathing appears stable. Asymptomatic, not on any bronchodilator. cxr 04/01/15 shows no hyperinflation or copd changes. As per family, no known hx of copd in past and no known smoking history. Will remove this diagnosis. monitor  Protein calorie malnutrition Weight 105.6 lb. Weight remains low but stable. Poor po intake. Currently on remeron 7.5 mg daily with prostat with meals. Monitor weight and increase remeron to 15 mg daily and reassess. If no improvement. D/c remeron completely if no change in appetite after 1 month of 15 mg daily dosing.    [name], MD  [facility] [phone] (Monday-Friday 8 am - 5 pm) [phone] (afterhours)

## 2015-08-04 ENCOUNTER — Encounter: Payer: Self-pay | Admitting: Internal Medicine

## 2015-08-04 ENCOUNTER — Non-Acute Institutional Stay (SKILLED_NURSING_FACILITY): Payer: Medicare Other | Admitting: Internal Medicine

## 2015-08-04 DIAGNOSIS — G309 Alzheimer's disease, unspecified: Secondary | ICD-10-CM | POA: Diagnosis not present

## 2015-08-04 DIAGNOSIS — F329 Major depressive disorder, single episode, unspecified: Secondary | ICD-10-CM

## 2015-08-04 DIAGNOSIS — F0280 Dementia in other diseases classified elsewhere without behavioral disturbance: Secondary | ICD-10-CM

## 2015-08-04 DIAGNOSIS — F322 Major depressive disorder, single episode, severe without psychotic features: Secondary | ICD-10-CM

## 2015-08-04 DIAGNOSIS — K219 Gastro-esophageal reflux disease without esophagitis: Secondary | ICD-10-CM

## 2015-08-04 NOTE — Progress Notes (Signed)
Patient ID: Kristen [name], female   DOB: 1935/09/05, 80 y.o.   MRN: [id number]      [facility]  Code Status: Full Code   Chief Complaint  Patient presents with  . Medical Management of Chronic Issues    Routine Visit     Allergies  Allergen Reactions  . Erythromycin Base     Per MAR  . Penicillins     Per MAR    HPI 80 y/o female patient is seen for routine visit. Her copd is stable with no recent exacerbation. Her mood is stable. she requires assistance with her ADLs. Weight remains stable.    Review of Systems   Constitutional: Negative for fever, chills HENT: Negative for congestion   Respiratory: Negative for cough, shortness of breath and wheezing.    Cardiovascular: Negative for chest pain, palpitations Gastrointestinal: Negative for heartburn, nausea, vomiting, abdominal pain.   Genitourinary: Negative for dysuria   Musculoskeletal: Negative for falls. Has joint and back pain with hx of OA Skin: Negative for itching and rash.   Neurological: Negative for dizziness, headaches.   Psychiatric/Behavioral: Has memory loss.   Past medical history reviewed  Medication reviewed. See [facility]   Medication List       This list is accurate as of: 08/04/15 11:59 PM.  Always use your most recent med list.               acetaminophen 500 MG tablet  Commonly known as:  TYLENOL  Take 500 mg by mouth 2 (two) times daily. 0900 & 1700     acetaminophen 325 MG tablet  Commonly known as:  TYLENOL  Take 650 mg by mouth every 4 (four) hours as needed (For fever greater than 99.5).     AMBULATORY NON FORMULARY MEDICATION  Medication Name: Med Pass 120 mL three times daily     bisacodyl 10 MG suppository  Commonly known as:  DULCOLAX  Place 10 mg rectally as needed for moderate constipation.     calcitonin (salmon) 200 UNIT/ACT nasal spray  Commonly known as:  MIACALCIN/FORTICAL  Place 1 spray into alternate nostrils daily.     calcium-vitamin D 500-200  MG-UNIT per tablet  Commonly known as:  OSCAL WITH D  Take 1 tablet by mouth 2 (two) times daily.     cholecalciferol 1000 UNITS tablet  Commonly known as:  VITAMIN D  Take 1,000 Units by mouth daily.     Cranberry 400 MG Caps  Take 400 mg by mouth daily.     DULoxetine 20 MG capsule  Commonly known as:  CYMBALTA  Take 20 mg by mouth daily.     ferrous sulfate 325 (65 FE) MG tablet  Take 325 mg by mouth 2 (two) times daily with a meal.     folic acid 1 MG tablet  Commonly known as:  FOLVITE  Take 1 mg by mouth 2 (two) times daily. 0800 & 1700     lidocaine 5 %  Commonly known as:  LIDODERM  Place 1 patch onto the skin daily. Remove & Discard patch within 12 hours to her lower back     methotrexate 2.5 MG tablet  Commonly known as:  RHEUMATREX  Take 12.5 mg by mouth once a week. mondays     mirtazapine 7.5 MG tablet  Commonly known as:  REMERON  Take 7.5 mg by mouth at bedtime.     pantoprazole 20 MG tablet  Commonly known as:  PROTONIX  Take 20 mg by mouth daily.     polyethylene glycol packet  Commonly known as:  MIRALAX / GLYCOLAX  Take 17 g by mouth every other day.     promethazine 25 MG tablet  Commonly known as:  PHENERGAN  Take 25 mg by mouth every 6 (six) hours as needed for nausea or vomiting.     sennosides-docusate sodium 8.6-50 MG tablet  Commonly known as:  SENOKOT-S  Take 2 tablets by mouth daily.        Physical exam BP 106/70 mmHg  Pulse 71  Temp(Src) 96.6 F (35.9 C) (Oral)  Resp 19  Ht 5\' 5"  (1.651 m)  Wt 104 lb (47.174 kg)  BMI 17.31 kg/m2  SpO2 95%  Constitutional: thin elderly female in no acute distress. HEENT: Normocephalic and atraumatic. No icterus or pallor Neck: Supple and nontender. No lymphadenopathy Cardiac: Normal S1, S2. RRR, no murmurs. No edema.   Lungs: CTAB, no wheeze, rhonchi or crackles Abdomen: bowel sounds present, soft, nontender, nondistended. Musculoskeletal: able to move all extremities.  no  contractures Skin: Warm and dry. No rash noted. No erythema.   Neurological: Alert and oriented to self.   Labs 06/29/15 urine culture e.coli 05/08/15 wbc 6.1, hb 11.7, hct 35.3, plt 259, ferritin 27, %sat 14 03/10/15 iron 29, tibc 330, wbc 5.2, hb 10.7, hct 33.6, na 139, k 4.5, bun 11, cr 0.78, glu 77, ca 8.6, t.chol 188, tg 94, ldl 119, hdl 50, tsh 4.178 11/07/14 na 138, k 4.4, bun 14, cr 0.8, alb 3, rest of lft wnl, tsh 4.01 10/15/14 wbc 7.6, hb 10.5, hct 32.8, ncv 96.2, plt 285   Assessment/plan  gerd Stable on zantac 150 mg bid. Continue current regimen  Dementia without behavioral disturbance Stable, continue assistance with ADLs. Fall precautions. Pressure ulcer prophylaxis  Major depression Chronic and stable. Continue cymbalta 20 mg daily  10/17/14, MD  [facility] [phone] (Monday-Friday 8 am - 5 pm) [phone] (afterhours)

## 2015-08-30 DIAGNOSIS — F329 Major depressive disorder, single episode, unspecified: Secondary | ICD-10-CM | POA: Insufficient documentation

## 2015-09-09 ENCOUNTER — Non-Acute Institutional Stay (SKILLED_NURSING_FACILITY): Payer: Medicare Other | Admitting: Internal Medicine

## 2015-09-09 DIAGNOSIS — B351 Tinea unguium: Secondary | ICD-10-CM | POA: Diagnosis not present

## 2015-09-09 DIAGNOSIS — I1310 Hypertensive heart and chronic kidney disease without heart failure, with stage 1 through stage 4 chronic kidney disease, or unspecified chronic kidney disease: Secondary | ICD-10-CM

## 2015-09-09 DIAGNOSIS — M810 Age-related osteoporosis without current pathological fracture: Secondary | ICD-10-CM

## 2015-09-09 DIAGNOSIS — D509 Iron deficiency anemia, unspecified: Secondary | ICD-10-CM

## 2015-09-09 LAB — HEMOGLOBIN A1C: HEMOGLOBIN A1C: 5.2

## 2015-09-09 NOTE — Progress Notes (Signed)
Patient ID: IKRAM RIEBE, female   DOB: 12-07-1935, 80 y.o.   MRN: [id number]  Patient ID: DAVIS VANNATTER, female   DOB: [birth year], 80 y.o.   MRN: [id number]      [facility]  Code Status: Full Code   Chief Complaint  Patient presents with  . Medical Management of Chronic Issues    Allergies  Allergen Reactions  . Erythromycin Base     Per MAR  . Penicillins     Per MAR    HPI 80 y/o female patient is seen for routine visit. Staff have noticed thickening and discomfort in her left 5th toenail. no other concern from staff. Her copd is stable with no recent exacerbation. Her mood is stable. she requires assistance with her ADLs. No falls reported.    Review of Systems   Constitutional: Negative for fever, chills HENT: Negative for congestion   Respiratory: Negative for cough, shortness of breath and wheezing.    Cardiovascular: Negative for chest pain, palpitations Gastrointestinal: Negative for heartburn, nausea, vomiting, abdominal pain.   Genitourinary: Negative for dysuria   Musculoskeletal: Negative for falls. Has joint and back pain with hx of OA Skin: Negative for itching and rash.   Neurological: Negative for dizziness, headaches.   Psychiatric/Behavioral: Has memory loss.   Past medical history reviewed  Medication reviewed. See [facility]   Medication List       This list is accurate as of: 09/09/15  2:45 PM.  Always use your most recent med list.               acetaminophen 500 MG tablet  Commonly known as:  TYLENOL  Take 500 mg by mouth 2 (two) times daily. 0900 & 1700     acetaminophen 325 MG tablet  Commonly known as:  TYLENOL  Take 650 mg by mouth every 4 (four) hours as needed (For fever greater than 99.5).     acetaminophen 500 MG tablet  Commonly known as:  TYLENOL  Take 500 mg by mouth 2 (two) times daily.     AMBULATORY NON FORMULARY MEDICATION  Medication Name: Med Pass 120 mL three times daily     bisacodyl 10 MG  suppository  Commonly known as:  DULCOLAX  Place 10 mg rectally as needed for moderate constipation.     calcitonin (salmon) 200 UNIT/ACT nasal spray  Commonly known as:  MIACALCIN/FORTICAL  Place 1 spray into alternate nostrils daily.     calcium-vitamin D 500-200 MG-UNIT per tablet  Commonly known as:  OSCAL WITH D  Take 1 tablet by mouth 2 (two) times daily.     cholecalciferol 1000 UNITS tablet  Commonly known as:  VITAMIN D  Take 1,000 Units by mouth daily.     Cranberry 400 MG Caps  Take 400 mg by mouth daily.     DULoxetine 20 MG capsule  Commonly known as:  CYMBALTA  Take 20 mg by mouth daily.     ferrous sulfate 325 (65 FE) MG tablet  Take 325 mg by mouth 2 (two) times daily with a meal.     folic acid 1 MG tablet  Commonly known as:  FOLVITE  Take 1 mg by mouth 2 (two) times daily. 0800 & 1700     lidocaine 5 %  Commonly known as:  LIDODERM  Place 1 patch onto the skin daily. Remove & Discard patch within 12 hours to her lower back     methotrexate 2.5 MG tablet  Commonly known as:  RHEUMATREX  Take 12.5 mg by mouth once a week. mondays     mirtazapine 7.5 MG tablet  Commonly known as:  REMERON  Take 7.5 mg by mouth at bedtime.     pantoprazole 20 MG tablet  Commonly known as:  PROTONIX  Take 20 mg by mouth daily.     polyethylene glycol packet  Commonly known as:  MIRALAX / GLYCOLAX  Take 17 g by mouth every other day.     promethazine 25 MG tablet  Commonly known as:  PHENERGAN  Take 25 mg by mouth every 6 (six) hours as needed for nausea or vomiting.     sennosides-docusate sodium 8.6-50 MG tablet  Commonly known as:  SENOKOT-S  Take 2 tablets by mouth daily.        Physical exam BP 110/65 mmHg  Pulse 78  Temp(Src) 98.7 F (37.1 C)  Resp 18  SpO2 95%  Constitutional: thin elderly female in no acute distress. HEENT: Normocephalic and atraumatic. No icterus or pallor Neck: Supple and nontender. No lymphadenopathy Cardiac: Normal S1,  S2. RRR, no murmurs. No edema.   Lungs: CTAB, no wheeze, rhonchi or crackles Abdomen: bowel sounds present, soft, nontender, nondistended. Musculoskeletal: able to move all extremities.  no contractures Skin: Warm and dry. No rash noted. No erythema.   Nails: has tinea unguium to her toe nails with thickened 5th toe nail on left foot with mild erythema at lateral nail bed. Neurological: Alert and oriented to self.   Labs 06/29/15 urine culture e.coli 05/08/15 wbc 6.1, hb 11.7, hct 35.3, plt 259, ferritin 27, %sat 14 03/10/15 iron 29, tibc 330, wbc 5.2, hb 10.7, hct 33.6, na 139, k 4.5, bun 11, cr 0.78, glu 77, ca 8.6, t.chol 188, tg 94, ldl 119, hdl 50, tsh 4.178 11/07/14 na 138, k 4.4, bun 14, cr 0.8, alb 3, rest of lft wnl, tsh 4.01 10/15/14 wbc 7.6, hb 10.5, hct 32.8, ncv 96.2, plt 285   Assessment/plan  Tinea unguium onychomycosis to all the toes with thickened nail on 5th toe. Podiatry referral to assess further. No signs of bacterial infection at present  HTN Chronic and stable, continue lisinopril 5 mg daily for now and monitor bp reading  Iron def anemia On ferrous sulfate 300 mg bid, stable at present, monitor h&h  Age related osteoporosis No pathological fracture recently. Stable on miacalcin nasal spray with vitamin d and calcium supplement, monitor, fall precautions   [name], MD  [facility] [phone] (Monday-Friday 8 am - 5 pm) [phone] (afterhours)

## 2015-09-17 ENCOUNTER — Inpatient Hospital Stay (HOSPITAL_COMMUNITY): Payer: Medicare Other

## 2015-09-17 ENCOUNTER — Encounter (HOSPITAL_COMMUNITY)
Admission: EM | Disposition: A | Discharge status: Skilled Nursing Facility | Payer: Self-pay | Source: Home / Self Care | Attending: Family Medicine

## 2015-09-17 ENCOUNTER — Encounter (HOSPITAL_COMMUNITY): Payer: Self-pay | Admitting: *Deleted

## 2015-09-17 ENCOUNTER — Emergency Department (HOSPITAL_COMMUNITY): Payer: Medicare Other

## 2015-09-17 ENCOUNTER — Inpatient Hospital Stay (HOSPITAL_COMMUNITY)
Admission: EM | Admit: 2015-09-17 | Discharge: 2015-09-22 | DRG: 481 | Disposition: A | Discharge status: Skilled Nursing Facility | Payer: Medicare Other | Attending: Family Medicine | Admitting: Family Medicine

## 2015-09-17 DIAGNOSIS — J410 Simple chronic bronchitis: Secondary | ICD-10-CM | POA: Diagnosis not present

## 2015-09-17 DIAGNOSIS — F329 Major depressive disorder, single episode, unspecified: Secondary | ICD-10-CM | POA: Diagnosis present

## 2015-09-17 DIAGNOSIS — J449 Chronic obstructive pulmonary disease, unspecified: Secondary | ICD-10-CM | POA: Diagnosis present

## 2015-09-17 DIAGNOSIS — Z881 Allergy status to other antibiotic agents status: Secondary | ICD-10-CM | POA: Diagnosis not present

## 2015-09-17 DIAGNOSIS — S72002S Fracture of unspecified part of neck of left femur, sequela: Secondary | ICD-10-CM

## 2015-09-17 DIAGNOSIS — Z419 Encounter for procedure for purposes other than remedying health state, unspecified: Secondary | ICD-10-CM

## 2015-09-17 DIAGNOSIS — G309 Alzheimer's disease, unspecified: Secondary | ICD-10-CM | POA: Diagnosis present

## 2015-09-17 DIAGNOSIS — W06XXXA Fall from bed, initial encounter: Secondary | ICD-10-CM | POA: Diagnosis present

## 2015-09-17 DIAGNOSIS — K922 Gastrointestinal hemorrhage, unspecified: Secondary | ICD-10-CM | POA: Diagnosis present

## 2015-09-17 DIAGNOSIS — S72102A Unspecified trochanteric fracture of left femur, initial encounter for closed fracture: Secondary | ICD-10-CM | POA: Diagnosis present

## 2015-09-17 DIAGNOSIS — K219 Gastro-esophageal reflux disease without esophagitis: Secondary | ICD-10-CM | POA: Diagnosis present

## 2015-09-17 DIAGNOSIS — S72142A Displaced intertrochanteric fracture of left femur, initial encounter for closed fracture: Principal | ICD-10-CM | POA: Diagnosis present

## 2015-09-17 DIAGNOSIS — M810 Age-related osteoporosis without current pathological fracture: Secondary | ICD-10-CM | POA: Diagnosis present

## 2015-09-17 DIAGNOSIS — S72001B Fracture of unspecified part of neck of right femur, initial encounter for open fracture type I or II: Secondary | ICD-10-CM | POA: Diagnosis not present

## 2015-09-17 DIAGNOSIS — J438 Other emphysema: Secondary | ICD-10-CM

## 2015-09-17 DIAGNOSIS — M25522 Pain in left elbow: Secondary | ICD-10-CM | POA: Diagnosis present

## 2015-09-17 DIAGNOSIS — S72009A Fracture of unspecified part of neck of unspecified femur, initial encounter for closed fracture: Secondary | ICD-10-CM | POA: Diagnosis present

## 2015-09-17 DIAGNOSIS — I10 Essential (primary) hypertension: Secondary | ICD-10-CM | POA: Diagnosis present

## 2015-09-17 DIAGNOSIS — Y92129 Unspecified place in nursing home as the place of occurrence of the external cause: Secondary | ICD-10-CM

## 2015-09-17 DIAGNOSIS — D638 Anemia in other chronic diseases classified elsewhere: Secondary | ICD-10-CM | POA: Diagnosis present

## 2015-09-17 DIAGNOSIS — R52 Pain, unspecified: Secondary | ICD-10-CM

## 2015-09-17 DIAGNOSIS — Z880 Allergy status to penicillin: Secondary | ICD-10-CM

## 2015-09-17 DIAGNOSIS — W19XXXA Unspecified fall, initial encounter: Secondary | ICD-10-CM

## 2015-09-17 DIAGNOSIS — S72001A Fracture of unspecified part of neck of right femur, initial encounter for closed fracture: Secondary | ICD-10-CM

## 2015-09-17 DIAGNOSIS — Z09 Encounter for follow-up examination after completed treatment for conditions other than malignant neoplasm: Secondary | ICD-10-CM

## 2015-09-17 DIAGNOSIS — Z79899 Other long term (current) drug therapy: Secondary | ICD-10-CM

## 2015-09-17 DIAGNOSIS — M069 Rheumatoid arthritis, unspecified: Secondary | ICD-10-CM | POA: Diagnosis present

## 2015-09-17 DIAGNOSIS — M05711 Rheumatoid arthritis with rheumatoid factor of right shoulder without organ or systems involvement: Secondary | ICD-10-CM | POA: Diagnosis not present

## 2015-09-17 DIAGNOSIS — F0280 Dementia in other diseases classified elsewhere without behavioral disturbance: Secondary | ICD-10-CM | POA: Diagnosis present

## 2015-09-17 DIAGNOSIS — D62 Acute posthemorrhagic anemia: Secondary | ICD-10-CM | POA: Diagnosis not present

## 2015-09-17 DIAGNOSIS — Z87891 Personal history of nicotine dependence: Secondary | ICD-10-CM | POA: Diagnosis not present

## 2015-09-17 DIAGNOSIS — E559 Vitamin D deficiency, unspecified: Secondary | ICD-10-CM | POA: Diagnosis present

## 2015-09-17 LAB — BASIC METABOLIC PANEL
Anion gap: 9 (ref 5–15)
BUN: 17 mg/dL (ref 6–20)
CALCIUM: 8.6 mg/dL — AB (ref 8.9–10.3)
CHLORIDE: 98 mmol/L — AB (ref 101–111)
CO2: 24 mmol/L (ref 22–32)
CREATININE: 0.79 mg/dL (ref 0.44–1.00)
GFR calc non Af Amer: >60 mL/min (ref >60)
Glucose, Bld: 193 mg/dL — ABNORMAL HIGH (ref 65–99)
Potassium: 4.5 mmol/L (ref 3.5–5.1)
Sodium: 131 mmol/L — ABNORMAL LOW (ref 135–145)

## 2015-09-17 LAB — APTT: APTT: 29 s (ref 24–37)

## 2015-09-17 LAB — CBC WITH DIFFERENTIAL/PLATELET
BASOS PCT: 0 %
Basophils Absolute: 0 10*3/uL (ref 0.0–0.1)
EOS ABS: 0 10*3/uL (ref 0.0–0.7)
Eosinophils Relative: 0 %
HEMATOCRIT: 35 % — AB (ref 36.0–46.0)
HEMOGLOBIN: 11.4 g/dL — AB (ref 12.0–15.0)
LYMPHS ABS: 0.4 10*3/uL — AB (ref 0.7–4.0)
Lymphocytes Relative: 2 %
MCH: 28.9 pg (ref 26.0–34.0)
MCHC: 32.6 g/dL (ref 30.0–36.0)
MCV: 88.6 fL (ref 78.0–100.0)
MONOS PCT: 3 %
Monocytes Absolute: 0.4 10*3/uL (ref 0.1–1.0)
Neutro Abs: 13.9 10*3/uL — ABNORMAL HIGH (ref 1.7–7.7)
Neutrophils Relative %: 95 %
Platelets: 323 10*3/uL (ref 150–400)
RBC: 3.95 MIL/uL (ref 3.87–5.11)
RDW: 16.6 % — AB (ref 11.5–15.5)
WBC: 14.6 10*3/uL — ABNORMAL HIGH (ref 4.0–10.5)

## 2015-09-17 LAB — PROTIME-INR
INR: 1.2 (ref 0.00–1.49)
Prothrombin Time: 15.3 seconds — ABNORMAL HIGH (ref 11.6–15.2)

## 2015-09-17 LAB — TYPE AND SCREEN
ABO/RH(D): O NEG
Antibody Screen: NEGATIVE

## 2015-09-17 LAB — GASTRIC OCCULT BLOOD (1-CARD TO LAB): Occult Blood, Gastric: POSITIVE — AB

## 2015-09-17 LAB — HEMOGLOBIN AND HEMATOCRIT, BLOOD
HEMATOCRIT: 35.1 % — AB (ref 36.0–46.0)
Hemoglobin: 11.4 g/dL — ABNORMAL LOW (ref 12.0–15.0)

## 2015-09-17 LAB — POCT GASTRIC OCCULT BLOOD (1-CARD TO LAB)

## 2015-09-17 LAB — ABO/RH: ABO/RH(D): O NEG

## 2015-09-17 SURGERY — FIXATION, FRACTURE, INTERTROCHANTERIC, WITH INTRAMEDULLARY ROD
Anesthesia: General | Laterality: Left

## 2015-09-17 MED ORDER — METHOCARBAMOL 1000 MG/10ML IJ SOLN: 500.0000 mg | Freq: Four times a day (QID) | INTRAVENOUS | Status: DC | PRN

## 2015-09-17 MED ORDER — MORPHINE SULFATE (PF) 2 MG/ML IV SOLN: 0.5000 mg | INTRAVENOUS | Status: DC | PRN

## 2015-09-17 MED ORDER — HYDROCODONE-ACETAMINOPHEN 5-325 MG PO TABS
1.0000 | ORAL_TABLET | Freq: Four times a day (QID) | ORAL | Status: DC | PRN
  Administered 2015-09-17: 1 via ORAL
  Filled 2015-09-17: qty 1

## 2015-09-17 MED ORDER — METHOCARBAMOL 500 MG PO TABS: 500.0000 mg | ORAL_TABLET | Freq: Four times a day (QID) | ORAL | Status: DC | PRN

## 2015-09-17 MED ORDER — ONDANSETRON HCL 4 MG/2ML IJ SOLN
4.0000 mg | Freq: Once | INTRAMUSCULAR | Status: AC
  Administered 2015-09-17: 4 mg via INTRAVENOUS
  Filled 2015-09-17: qty 2

## 2015-09-17 MED ORDER — PANTOPRAZOLE SODIUM 40 MG IV SOLR
40.0000 mg | INTRAVENOUS | Status: DC
  Administered 2015-09-17 – 2015-09-18 (×2): 40 mg via INTRAVENOUS
  Filled 2015-09-17 (×2): qty 40

## 2015-09-17 NOTE — ED Notes (Signed)
MD at bedside. 

## 2015-09-17 NOTE — Progress Notes (Signed)
Called Diora's sister Darl Pikes Per Dr Linna Caprice [phone] states she is on her way

## 2015-09-17 NOTE — ED Notes (Signed)
Radiology called this RN and stated that the pt threw up blood. There is some brown emesis on the pts sheet. The emesis bag that the pt used was thrown away in radiology.

## 2015-09-17 NOTE — Progress Notes (Signed)
8:26 AM I agree with HPI/GPe and A/P per Dr. Eldridge Dace    80 -year-old female with moderate to severe dementia, major depression, reflux, vitamin D deficiency, rheumatoid arthritis, history of recurrent UTIs admitted from [facility] with history of fall and found to have a comminuted displaced intertrochanteric left femoral fracture She cannot tell me any of the history and by report apparently patient had vomiting of dark emesis which was Gastroccult positive  Nursing reports some dark urine as well which looked bloody Her hemoglobin however has remained rock solid stable   HEENT alert but confused and cannot tell me where she has what the date is what state she is in CHEST clinically clear no added sound CARDIAC S1-S2 no murmur rub or gallop ABDOMEN soft stool masses noted NEURO power 5/5 bilaterally SKIN/MUSCULAR limitation of range of motion to left upper arm and pain on movement of the arm  Patient Active Problem List   Diagnosis Date Noted  . Hip fracture 09/17/2015  . Hypertensive heart and renal disease 09/09/2015  . Iron deficiency anemia 09/09/2015  . Major depression, chronic 08/30/2015  . Elevated blood pressure 05/07/2015  . Essential hypertension 05/07/2015  . Anemia, iron deficiency 05/07/2015  . 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 Senile osteoporosis 03/10/2015  . Recurrent UTI 01/14/2015  . Protein-calorie malnutrition 01/14/2015  . Anemia of chronic disease 01/14/2015  . Loss of weight 10/14/2014  . Osteoporosis 10/14/2014  . Depression 10/14/2014  . Dementia without behavioral disturbance 09/11/2014  . Recurrent cystitis 08/31/2014  . Vitamin D deficiency 07/12/2014  . Back pain 01/11/2014  . RA (rheumatoid arthritis) 05/15/2013  . HTN (hypertension) 05/15/2013  . Vitamin B12 deficiency 05/15/2013  . GERD (gastroesophageal reflux disease) 05/15/2013  . COPD (chronic obstructive pulmonary disease)  05/15/2013  . Unspecified constipation 05/15/2013  . UTI (urinary tract infection) 05/15/2013   Hip fracture EKG benign, chest x-ray benign Not cleared for surgery as below  GI bleed I'm not sure she has a GI bleed and I would not clear her for surgery just yet. I would attempt a soft diet or a clear liquid diet and see if this aggravates anything and if needed we will call gastroenterology to consult on the patient if she does have further dark emesis. She should continue Protonix IV 40 mg every 24 hourly and we'll repeat a blood count in the morning  Elbow pain Get stat x-ray two-view of the left elbow as she may have an occult fracture  HTN Restart lisinopril 5 mg daily  Osteoporosis Continue Os-Cal 500 twice a day, calcitonin presumably for pathologic fractures in the past  Rheumatoid arthritis Continue methotrexate 12.5 every Monday  Appetite, depression Continue Remeron 15 mg daily at bedtime  Pleas Koch, MD [facility] [phone]

## 2015-09-17 NOTE — Consult Note (Addendum)
ORTHOPAEDIC CONSULTATION  REQUESTING PHYSICIAN: Rhetta Mura, MD  PCP:  [name], MD  Chief Complaint: left hip pain  HPI: Kristen STERTZ is a 80 y.o. female who complains of  Left hip pain and inability to ambulate. Nursing home resident with advanced dementia who was found down last night at her facility. Denies other injuries. States she normally ambulates in her room with a walker.  Past Medical History  Diagnosis Date  . RA (rheumatoid arthritis) 05/15/2013  . HTN (hypertension) 05/15/2013  . GERD (gastroesophageal reflux disease) 05/15/2013  . COPD (chronic obstructive pulmonary disease) 05/15/2013  . Unspecified constipation 05/15/2013  . UTI (urinary tract infection) 05/15/2013  . HCAP (healthcare-associated pneumonia) 01/13/2014   History reviewed. No pertinent past surgical history. Social History   Social History  . Marital Status: Single    Spouse Name: N/A  . Number of Children: N/A  . Years of Education: N/A   Social History Main Topics  . Smoking status: Former Games developer  . Smokeless tobacco: None  . Alcohol Use: No  . Drug Use: No  . Sexual Activity: Not Currently   Other Topics Concern  . None   Social History Narrative   History reviewed. No pertinent family history. Allergies  Allergen Reactions  . Erythromycin Base     Per MAR  . Penicillins     Per MAR   Prior to Admission medications   Medication Sig Start Date End Date Taking? Authorizing Provider  acetaminophen (TYLENOL) 325 MG tablet Take 650 mg by mouth every 4 (four) hours as needed (For fever greater than 99.5).   Yes Historical Provider, MD  acetaminophen (TYLENOL) 500 MG tablet Take 500 mg by mouth 2 (two) times daily. 0900 & 1700   Yes [name], NP  calcitonin, salmon, (MIACALCIN/FORTICAL) 200 UNIT/ACT nasal spray Place 1 spray into alternate nostrils daily.   Yes Historical Provider, MD  calcium-vitamin D (OSCAL WITH D) 500-200 MG-UNIT per tablet Take 1 tablet by mouth 2  (two) times daily.    Yes Historical Provider, MD  cholecalciferol (VITAMIN D) 1000 UNITS tablet Take 1,000 Units by mouth daily.   Yes Historical Provider, MD  Cranberry 200 MG CAPS Take 400 mg by mouth 2 (two) times daily.   Yes Historical Provider, MD  DULoxetine (CYMBALTA) 20 MG capsule Take 20 mg by mouth daily.   Yes Historical Provider, MD  ferrous sulfate 220 (44 FE) MG/5ML solution Take 325 mg by mouth 2 (two) times daily with a meal.   Yes Historical Provider, MD  folic acid (FOLVITE) 1 MG tablet Take 1 mg by mouth 2 (two) times daily. 0800 & 1700   Yes [name], NP  lisinopril (PRINIVIL,ZESTRIL) 5 MG tablet Take 5 mg by mouth daily.   Yes Historical Provider, MD  Menthol, Topical Analgesic, (BIOFREEZE) 4 % GEL Apply topically every 6 (six) hours.   Yes Historical Provider, MD  methotrexate (RHEUMATREX) 2.5 MG tablet Take 12.5 mg by mouth once a week. mondays   Yes [name], NP  mirtazapine (REMERON) 15 MG tablet Take 15 mg by mouth at bedtime.   Yes Historical Provider, MD  polyethylene glycol (MIRALAX / GLYCOLAX) packet Take 17 g by mouth every other day.   Yes Historical Provider, MD  ranitidine (ZANTAC) 150 MG tablet Take 150 mg by mouth 2 (two) times daily.   Yes Historical Provider, MD  sennosides-docusate sodium (SENOKOT-S) 8.6-50 MG tablet Take 2 tablets by mouth daily.   Yes Historical Provider, MD  traMADol (ULTRAM) 50 MG tablet Take 25 mg by mouth every 12 (twelve) hours as needed for moderate pain.   Yes Historical Provider, MD  UNABLE TO FIND Take 120 mLs by mouth 3 (three) times daily. Med Name: MedPass   Yes Historical Provider, MD  lidocaine (LIDODERM) 5 % Place 1 patch onto the skin daily. Remove & Discard patch within 12 hours to her lower back Patient not taking: Reported on 09/17/2015 01/11/14   [name], NP   Dg Chest 1 View  09/17/2015   CLINICAL DATA:  Unwitnessed fall.  EXAM: CHEST 1 VIEW  COMPARISON:  03/20/2014  FINDINGS: Lower lung volumes from  prior exam. Cardiomediastinal contours are unchanged allowing for differences in technique and patient rotation. There is linear subsegmental atelectasis at the right lung base. No consolidation to suggest pneumonia. No pleural effusion or pneumothorax. Probable compression deformities of lower thoracic spine.  IMPRESSION: Hypoventilatory chest with subsegmental atelectasis at the right lung base.   Electronically Signed   By: Rubye Oaks M.D.   On: 09/17/2015 04:11   Ct Head Wo Contrast  09/17/2015   CLINICAL DATA:  Unwitnessed fall just prior to arrival. Fall out of bed this evening.  EXAM: CT HEAD WITHOUT CONTRAST  TECHNIQUE: Contiguous axial images were obtained from the base of the skull through the vertex without intravenous contrast.  COMPARISON:  08/11/2013  FINDINGS: Diffuse cerebellar and cerebral atrophy. Advanced chronic small vessel ischemic change.No intracranial hemorrhage, mass effect, or midline shift. No hydrocephalus. The basilar cisterns are patent. No evidence of territorial infarct. No intracranial fluid collection. Calvarium is intact. Chronic sphenoid sinus disease, with near complete opacification of the left-sided sphenoid sinus and central calcification. Remaining paranasal sinuses are well-aerated. The mastoid air cells are well aerated.  IMPRESSION: 1.  No acute intracranial abnormality. 2. Atrophy and advanced chronic small vessel ischemia.   Electronically Signed   By: Rubye Oaks M.D.   On: 09/17/2015 03:10   Dg Hip Unilat With Pelvis 2-3 Views Left  09/17/2015   CLINICAL DATA:  80 year old female post unwitnessed fall, now with left hip pain.  EXAM: DG HIP (WITH OR WITHOUT PELVIS) 2-3V LEFT  COMPARISON:  None.  FINDINGS: There is a comminuted displaced fracture of the intertrochanteric left hip involving the lesser and greater trochanters. Mild proximal migration of the femoral shaft. The femoral head remains seated in the acetabulum. No evidence of additional  fracture. The bones are under mineralized.  IMPRESSION: Comminuted displaced intertrochanteric left hip fracture.   Electronically Signed   By: Rubye Oaks M.D.   On: 09/17/2015 03:07   Dg Femur Min 2 Views Left  09/17/2015   CLINICAL DATA:  Evaluate hip fracture  EXAM: LEFT FEMUR 2 VIEWS  COMPARISON:  Pelvis radiography from earlier today  FINDINGS: Limited study due to patient condition.  There is an acute intertrochanteric left femur fracture with varus angulation and proximal migration of the distal fragment. Fracture is comminuted with isolated lesser trochanter fragment. Femoral head is located. No additional fracture is seen. Osteopenia without evidence of focal bone lesion.  IMPRESSION: Comminuted, displaced intertrochanteric left femur fracture.   Electronically Signed   By: Marnee Spring M.D.   On: 09/17/2015 04:32    Positive ROS: All other systems have been reviewed and were otherwise negative with the exception of those mentioned in the HPI and as above.  Physical Exam: General: Alert, no acute distress Cardiovascular: No pedal edema Respiratory: No cyanosis, no use of accessory musculature  GI: No organomegaly, abdomen is soft and non-tender Skin: No lesions in the area of chief complaint Neurologic: Sensation intact distally Psychiatric: Patient is competent for consent with normal mood and affect Lymphatic: No axillary or cervical lymphadenopathy  MUSCULOSKELETAL: LLE shortened and externally rotated. Pain with log rolling. +TA/GS/EHL. 2+ DP. SILT.  Assessment: L IT femur fx  Plan: Needs operative fixation for pain control and to mobilize Needs perioperative risk stratification / medical optimization by primary team Plan for OR later today   Garnet Koyanagi, MD Cell [phone]    09/17/2015 7:40 AM    ADDENDUM: primary team worried about possible GI bleed, therefore not cleared for surgery. Will follow.

## 2015-09-17 NOTE — ED Notes (Signed)
Pt to ED from [facility] via [place] c/o unwitnessed fall. Per EMS pt is usually not ambulatory. Deformity to L hip; sheet in place for stability. Pt confused at baseline fentanyl and 4mg  Zofran. BP 110/60 HR 90s

## 2015-09-17 NOTE — Clinical Social Work Note (Signed)
Clinical Social Work Assessment  Patient Details  Name: Kristen [name] MRN: [id number] Date of Birth: 02-17-1935  Date of referral:  09/17/15               Reason for consult:  Other (Comment Required) (Admitted from facility: [facility])                Permission sought to share information with:  Facility Medical sales representative, Family Supports Permission granted to share information::     Name::     Shan Levans  Agency::  [facility]  Relationship::  Sister/HCPOA  Contact Information:  [phone]  Housing/Transportation Living arrangements for the past 2 months:  [facility] Nursing [facility] Source of Information:  Other (Comment Required) (Sibling/HCPOA) Patient Interpreter Needed:  None Criminal Activity/Legal Involvement Pertinent to Current Situation/Hospitalization:  No - Comment as needed Significant Relationships:  Siblings Lives with:  Facility Resident [facility] (long-term care)) Do you feel safe going back to the place where you live?  Yes (Patient's sister/HCPOA anticipates patient to return to [facility] once medically stable.) Need for family participation in patient care:  Yes (Comment) (Patient disoriented x4.)  Care giving concerns:  Patient's sister/HCPOA expressed no concerns at this time.   Social Worker assessment / plan:  CSW received referral regarding patient admitted from SNF. CSW spoke with patient's sister/HCPOA regarding patient's discharge disposition. Per patient's sister/HCPOA, patient to be discharged back to [facility] once medically stable for discharge. Patient's sister/HCPOA informed CSW patient is a long-term care resident at [facility] and has been residing at the facility since August of 2013. CSW has updated [employer] regarding patient's anticipated discharge disposition. CSW continuing to follow and assist with discharge planning needs.   Employment status:  Retired, Disabled (Comment on whether  or not currently receiving Disability) Insurance information:  Medicaid In [place] PT Recommendations:  Not assessed at this time Information / Referral to community resources:  Other (Comment Required) (Patient to return to [facility] once medically stable.)  Patient/Family's Response to care:  Patient's sister/HCPOA understanding and agreeable to CSW plan of care.  Patient/Family's Understanding of and Emotional Response to Diagnosis, Current Treatment, and Prognosis:  Patient's sister/HCPOA understanding and agreeable to CSW plan of care.  Emotional Assessment Appearance:  Other (Comment Required (CSW spoke with patient's HCPOA as patient disoriented x4.) Attitude/Demeanor/Rapport:  Other (CSW spoke with patient's HCPOA as patient disoriented x4.) Affect (typically observed):  Other (CSW spoke with patient's HCPOA as patient disoriented x4.) Orientation:    Alcohol / Substance use:  Not Applicable Psych involvement (Current and /or in the community):  No (Comment) (Not appropriate on this admission.)  Discharge Needs  Concerns to be addressed:  No discharge needs identified Readmission within the last 30 days:  No Current discharge risk:  None Barriers to Discharge:  No Barriers Identified   Rod Mae, LCSW 09/17/2015, 11:49 AM [phone]

## 2015-09-17 NOTE — ED Notes (Signed)
Patient transported to X-ray 

## 2015-09-17 NOTE — H&P (Addendum)
PCP:   [name], MD   Chief Complaint:  Found down  HPI: 80 yo female with advanced dementia comes in from EMS after being found on the floor.  Was in pain.  Unknown ambulatory status, possibly walks with a walker prior to fall.  Pt cannot provide any history due to her dementia, she does not recall falling.  Denies pain.    Review of Systems:  Unobtainable due to dementia  Past Medical History: Past Medical History  Diagnosis Date  . RA (rheumatoid arthritis) 05/15/2013  . HTN (hypertension) 05/15/2013  . GERD (gastroesophageal reflux disease) 05/15/2013  . COPD (chronic obstructive pulmonary disease) 05/15/2013  . Unspecified constipation 05/15/2013  . UTI (urinary tract infection) 05/15/2013  . HCAP (healthcare-associated pneumonia) 01/13/2014   History reviewed. No pertinent past surgical history.  Medications: Prior to Admission medications   Medication Sig Start Date End Date Taking? Authorizing Provider  acetaminophen (TYLENOL) 325 MG tablet Take 650 mg by mouth every 4 (four) hours as needed (For fever greater than 99.5).   Yes Historical Provider, MD  acetaminophen (TYLENOL) 500 MG tablet Take 500 mg by mouth 2 (two) times daily. 0900 & 1700   Yes [name], NP  calcitonin, salmon, (MIACALCIN/FORTICAL) 200 UNIT/ACT nasal spray Place 1 spray into alternate nostrils daily.   Yes Historical Provider, MD  calcium-vitamin D (OSCAL WITH D) 500-200 MG-UNIT per tablet Take 1 tablet by mouth 2 (two) times daily.    Yes Historical Provider, MD  cholecalciferol (VITAMIN D) 1000 UNITS tablet Take 1,000 Units by mouth daily.   Yes Historical Provider, MD  Cranberry 200 MG CAPS Take 400 mg by mouth 2 (two) times daily.   Yes Historical Provider, MD  DULoxetine (CYMBALTA) 20 MG capsule Take 20 mg by mouth daily.   Yes Historical Provider, MD  ferrous sulfate 220 (44 FE) MG/5ML solution Take 325 mg by mouth 2 (two) times daily with a meal.   Yes Historical Provider, MD  folic acid  (FOLVITE) 1 MG tablet Take 1 mg by mouth 2 (two) times daily. 0800 & 1700   Yes [name], NP  lisinopril (PRINIVIL,ZESTRIL) 5 MG tablet Take 5 mg by mouth daily.   Yes Historical Provider, MD  Menthol, Topical Analgesic, (BIOFREEZE) 4 % GEL Apply topically every 6 (six) hours.   Yes Historical Provider, MD  methotrexate (RHEUMATREX) 2.5 MG tablet Take 12.5 mg by mouth once a week. mondays   Yes [name], NP  mirtazapine (REMERON) 15 MG tablet Take 15 mg by mouth at bedtime.   Yes Historical Provider, MD  polyethylene glycol (MIRALAX / GLYCOLAX) packet Take 17 g by mouth every other day.   Yes Historical Provider, MD  ranitidine (ZANTAC) 150 MG tablet Take 150 mg by mouth 2 (two) times daily.   Yes Historical Provider, MD  sennosides-docusate sodium (SENOKOT-S) 8.6-50 MG tablet Take 2 tablets by mouth daily.   Yes Historical Provider, MD  traMADol (ULTRAM) 50 MG tablet Take 25 mg by mouth every 12 (twelve) hours as needed for moderate pain.   Yes Historical Provider, MD  UNABLE TO FIND Take 120 mLs by mouth 3 (three) times daily. Med Name: MedPass   Yes Historical Provider, MD  lidocaine (LIDODERM) 5 % Place 1 patch onto the skin daily. Remove & Discard patch within 12 hours to her lower back Patient not taking: Reported on 09/17/2015 01/11/14   [name], NP    Allergies:   Allergies  Allergen Reactions  . Erythromycin  Base     Per MAR  . Penicillins     Per MAR    Social History:  reports that she has quit smoking. She does not have any smokeless tobacco history on file. She reports that she does not drink alcohol or use illicit drugs.  Family History: Unobtainable due to dementia  Physical Exam: Filed Vitals:   09/17/15 0118 09/17/15 0130 09/17/15 0145  BP: 106/66 102/67 106/64  Pulse: 86 89 90  Temp: 98.2 F (36.8 C)    TempSrc: Oral    Resp: 16 20 21   SpO2: 92% 92% 94%   General appearance: alert, cooperative and no distress Head: Normocephalic, without  obvious abnormality, atraumatic Eyes: negative Nose: Nares normal. Septum midline. Mucosa normal. No drainage or sinus tenderness. Neck: no JVD and supple, symmetrical, trachea midline Lungs: clear to auscultation bilaterally Heart: regular rate and rhythm, S1, S2 normal, no murmur, click, rub or gallop Abdomen: soft, non-tender; bowel sounds normal; no masses,  no organomegaly Extremities: extremities normal, atraumatic, no cyanosis or edema  Pain with moving left hip Pulses: 2+ and symmetric Skin: Skin color, texture, turgor normal. No rashes or lesions Neurologic: Grossly normal    Labs on Admission:   Recent Labs  09/17/15 0240  NA 131*  K 4.5  CL 98*  CO2 24  GLUCOSE 193*  BUN 17  CREATININE 0.79  CALCIUM 8.6*      Recent Labs  09/17/15 0240  WBC 14.6*  NEUTROABS 13.9*  HGB 11.4*  HCT 35.0*  MCV 88.6  PLT 323    Radiological Exams on Admission: Ct Head Wo Contrast  09/17/2015   CLINICAL DATA:  Unwitnessed fall just prior to arrival. Fall out of bed this evening.  EXAM: CT HEAD WITHOUT CONTRAST  TECHNIQUE: Contiguous axial images were obtained from the base of the skull through the vertex without intravenous contrast.  COMPARISON:  08/11/2013  FINDINGS: Diffuse cerebellar and cerebral atrophy. Advanced chronic small vessel ischemic change.No intracranial hemorrhage, mass effect, or midline shift. No hydrocephalus. The basilar cisterns are patent. No evidence of territorial infarct. No intracranial fluid collection. Calvarium is intact. Chronic sphenoid sinus disease, with near complete opacification of the left-sided sphenoid sinus and central calcification. Remaining paranasal sinuses are well-aerated. The mastoid air cells are well aerated.  IMPRESSION: 1.  No acute intracranial abnormality. 2. Atrophy and advanced chronic small vessel ischemia.   Electronically Signed   By: 08/13/2013 M.D.   On: 09/17/2015 03:10   Dg Hip Unilat With Pelvis 2-3 Views  Left  09/17/2015   CLINICAL DATA:  80 year old female post unwitnessed fall, now with left hip pain.  EXAM: DG HIP (WITH OR WITHOUT PELVIS) 2-3V LEFT  COMPARISON:  None.  FINDINGS: There is a comminuted displaced fracture of the intertrochanteric left hip involving the lesser and greater trochanters. Mild proximal migration of the femoral shaft. The femoral head remains seated in the acetabulum. No evidence of additional fracture. The bones are under mineralized.  IMPRESSION: Comminuted displaced intertrochanteric left hip fracture.   Electronically Signed   By: 72 M.D.   On: 09/17/2015 03:07   preop ekg pending Old chart reviewed  Assessment/Plan  80 yo female with fall, advanced dementia, with new left hip fracture  Principal Problem:   Hip fracture, left - ED trying to get in touch with ortho oncall.  Keep npo and hold anticoagulants.  Unknown if patient a surgical candidate at this time.  Prn morphine ordered  Active Problems:  All home meds need clarification   RA (rheumatoid arthritis)- on MTX, hold at this time   COPD (chronic obstructive pulmonary disease)- stable , compensated   Anemia of chronic disease- stable   Dementia in Alzheimer's disease with depression- noted   Essential hypertension- stable  Code status per SNF paperwork is full code.  Admit to med surg.  Sadeen Wiegel A 09/17/2015, 3:30 AM  ED RN called to report that while pt in xray she vomited dark material.  Vomit was discarded.  Orders to check stat h/h, place on iv protonix and prn zofran.  Monitor for any further vomiting. afvss.

## 2015-09-17 NOTE — ED Provider Notes (Signed)
CSN: [id number]     Arrival date & time 09/17/15  0109 History    By signing my name below, I, Kristen [name], attest that this documentation has been prepared under the direction and in the presence of [name], MD. Electronically Signed: Arlan [name], ED Scribe. 09/17/2015. 1:49 AM.   Chief Complaint  Patient presents with  . Fall   The history is provided by the EMS personnel. No language interpreter was used.    LEVEL 5 CAVEAT DUE TO DEMENTIA   HPI Comments: Kristen [name] brought in by EMS is a 80 y.o. female with a PMHx of HTN, COPD, RA who presents to the Emergency Department here after an unwitnessed fall sustained just prior to arrival. Per EMS pt fell out of her bed this evening. Head trauma or LOC unknown. Pt now c/o constant, ongoing pain to knees bilaterally. Discomfort is excerbated with certain movements and deep palpation. No recent fever or chills. Pt with known allergies to Ethromycin base and Penicillins.  Past Medical History  Diagnosis Date  . RA (rheumatoid arthritis) 05/15/2013  . HTN (hypertension) 05/15/2013  . GERD (gastroesophageal reflux disease) 05/15/2013  . COPD (chronic obstructive pulmonary disease) 05/15/2013  . Unspecified constipation 05/15/2013  . UTI (urinary tract infection) 05/15/2013  . HCAP (healthcare-associated pneumonia) 01/13/2014   No past surgical history on file. No family history on file. Social History  Substance Use Topics  . Smoking status: Former Games developer  . Smokeless tobacco: Not on file  . Alcohol Use: No   OB History    No data available     Review of Systems  Unable to perform ROS: Dementia      Allergies  Erythromycin base and Penicillins  Home Medications   Prior to Admission medications   Medication Sig Start Date End Date Taking? Authorizing Provider  acetaminophen (TYLENOL) 325 MG tablet Take 650 mg by mouth every 4 (four) hours as needed (For fever greater than 99.5).    Historical Provider, MD  acetaminophen  (TYLENOL) 500 MG tablet Take 500 mg by mouth 2 (two) times daily. 0900 & 1700    [name], NP  acetaminophen (TYLENOL) 500 MG tablet Take 500 mg by mouth 2 (two) times daily.    Historical Provider, MD  AMBULATORY NON FORMULARY MEDICATION Medication Name: Med Pass 120 mL three times daily    Historical Provider, MD  bisacodyl (DULCOLAX) 10 MG suppository Place 10 mg rectally as needed for moderate constipation.    Historical Provider, MD  calcitonin, salmon, (MIACALCIN/FORTICAL) 200 UNIT/ACT nasal spray Place 1 spray into alternate nostrils daily.    Historical Provider, MD  calcium-vitamin D (OSCAL WITH D) 500-200 MG-UNIT per tablet Take 1 tablet by mouth 2 (two) times daily.     Historical Provider, MD  cholecalciferol (VITAMIN D) 1000 UNITS tablet Take 1,000 Units by mouth daily.    Historical Provider, MD  Cranberry 400 MG CAPS Take 400 mg by mouth daily.     Historical Provider, MD  DULoxetine (CYMBALTA) 20 MG capsule Take 20 mg by mouth daily.    Historical Provider, MD  ferrous sulfate 325 (65 FE) MG tablet Take 325 mg by mouth 2 (two) times daily with a meal.    Historical Provider, MD  folic acid (FOLVITE) 1 MG tablet Take 1 mg by mouth 2 (two) times daily. 0800 & 1700    [name], NP  lidocaine (LIDODERM) 5 % Place 1 patch onto the skin daily. Remove & Discard patch within  12 hours to her lower back 01/11/14   [name], NP  methotrexate (RHEUMATREX) 2.5 MG tablet Take 12.5 mg by mouth once a week. mondays    [name], NP  mirtazapine (REMERON) 7.5 MG tablet Take 7.5 mg by mouth at bedtime.    Historical Provider, MD  pantoprazole (PROTONIX) 20 MG tablet Take 20 mg by mouth daily.    Historical Provider, MD  polyethylene glycol (MIRALAX / GLYCOLAX) packet Take 17 g by mouth every other day.    Historical Provider, MD  promethazine (PHENERGAN) 25 MG tablet Take 25 mg by mouth every 6 (six) hours as needed for nausea or vomiting.    Historical Provider, MD   sennosides-docusate sodium (SENOKOT-S) 8.6-50 MG tablet Take 2 tablets by mouth daily.    Historical Provider, MD   Triage Vitals: BP 102/67 mmHg  Pulse 89  Temp(Src) 98.2 F (36.8 C) (Oral)  Resp 20  SpO2 92%   Physical Exam  Constitutional: She is oriented to person, place, and time. She appears well-developed and well-nourished. No distress.  HENT:  Head: Normocephalic and atraumatic.  Nose: Nose normal.  Mouth/Throat: Oropharynx is clear and moist. No oropharyngeal exudate.  Eyes: Conjunctivae and EOM are normal. Pupils are equal, round, and reactive to light. No scleral icterus.  Neck: Normal range of motion. Neck supple. No JVD present. No tracheal deviation present. No thyromegaly present.  Cardiovascular: Normal rate, regular rhythm, normal heart sounds and intact distal pulses.  Exam reveals no gallop and no friction rub.   No murmur heard. Pulmonary/Chest: Effort normal and breath sounds normal. No respiratory distress. She has no wheezes. She exhibits no tenderness.  Abdominal: Soft. Bowel sounds are normal. She exhibits no distension and no mass. There is no tenderness. There is no rebound and no guarding.  Musculoskeletal: Normal range of motion. She exhibits tenderness. She exhibits no edema.  L hip is shortened and externally rotated Tenderness to palpation of the proximal femur   Lymphadenopathy:    She has no cervical adenopathy.  Neurological: She is alert and oriented to person, place, and time. No cranial nerve deficit. She exhibits normal muscle tone.  Skin: Skin is warm and dry. No rash noted. No erythema. No pallor.  Nursing note and vitals reviewed.   ED Course  Procedures (including critical care time)  DIAGNOSTIC STUDIES: Oxygen Saturation is 92% on RA, Noraml by my interpretation.    COORDINATION OF CARE: 1:30 AM- Will order DG hip unilateral with 2-3 views L and CT head. Discussed treatment plan with pt at bedside and pt agreed to plan.     Labs  Review Labs Reviewed  CBC WITH DIFFERENTIAL/PLATELET - Abnormal; Notable for the following:    WBC 14.6 (*)    Hemoglobin 11.4 (*)    HCT 35.0 (*)    RDW 16.6 (*)    Neutro Abs 13.9 (*)    Lymphs Abs 0.4 (*)    All other components within normal limits  BASIC METABOLIC PANEL - Abnormal; Notable for the following:    Sodium 131 (*)    Chloride 98 (*)    Glucose, Bld 193 (*)    Calcium 8.6 (*)    All other components within normal limits  URINALYSIS, ROUTINE W REFLEX MICROSCOPIC (NOT AT [facility])    Imaging Review Ct Head Wo Contrast  09/17/2015   CLINICAL DATA:  Unwitnessed fall just prior to arrival. Fall out of bed this evening.  EXAM: CT HEAD WITHOUT CONTRAST  TECHNIQUE:  Contiguous axial images were obtained from the base of the skull through the vertex without intravenous contrast.  COMPARISON:  08/11/2013  FINDINGS: Diffuse cerebellar and cerebral atrophy. Advanced chronic small vessel ischemic change.No intracranial hemorrhage, mass effect, or midline shift. No hydrocephalus. The basilar cisterns are patent. No evidence of territorial infarct. No intracranial fluid collection. Calvarium is intact. Chronic sphenoid sinus disease, with near complete opacification of the left-sided sphenoid sinus and central calcification. Remaining paranasal sinuses are well-aerated. The mastoid air cells are well aerated.  IMPRESSION: 1.  No acute intracranial abnormality. 2. Atrophy and advanced chronic small vessel ischemia.   Electronically Signed   By: Rubye Oaks M.D.   On: 09/17/2015 03:10   Dg Hip Unilat With Pelvis 2-3 Views Left  09/17/2015   CLINICAL DATA:  80 year old female post unwitnessed fall, now with left hip pain.  EXAM: DG HIP (WITH OR WITHOUT PELVIS) 2-3V LEFT  COMPARISON:  None.  FINDINGS: There is a comminuted displaced fracture of the intertrochanteric left hip involving the lesser and greater trochanters. Mild proximal migration of the femoral shaft. The femoral head remains  seated in the acetabulum. No evidence of additional fracture. The bones are under mineralized.  IMPRESSION: Comminuted displaced intertrochanteric left hip fracture.   Electronically Signed   By: Rubye Oaks M.D.   On: 09/17/2015 03:07   I have personally reviewed and evaluated these images and lab results as part of my medical decision-making.   EKG Interpretation None      MDM   Final diagnoses:  None    Patient presents emergency department after an unwitnessed fall. X-ray reveals a left comminuted displaced intertrochanteric hip fracture. I will consult with orthopedic surgery, patient will likely be admitted to the hospitalist for further care. Of note, patient normally is not ambulatory per EMS. CT scan of the head was negative.  I spoke with [facility] who except this patient for admission. I spoke with orthopedic surgery requesting a refill of the femur. They were informed that the patient may not walk at baseline. They advised for NPO and they will evaluate the patient in the morning.  I personally performed the services described in this documentation, which was scribed in my presence. The recorded information has been reviewed and is accurate.   [name], MD 09/17/15 [phone]

## 2015-09-17 NOTE — ED Notes (Signed)
This RN on phone with Dr. Onalee Hua about reports of pt vomiting blood in radiology.

## 2015-09-17 NOTE — Progress Notes (Signed)
Utilization review completed.  

## 2015-09-18 DIAGNOSIS — F329 Major depressive disorder, single episode, unspecified: Secondary | ICD-10-CM

## 2015-09-18 DIAGNOSIS — I10 Essential (primary) hypertension: Secondary | ICD-10-CM

## 2015-09-18 DIAGNOSIS — M069 Rheumatoid arthritis, unspecified: Secondary | ICD-10-CM

## 2015-09-18 DIAGNOSIS — J410 Simple chronic bronchitis: Secondary | ICD-10-CM

## 2015-09-18 DIAGNOSIS — F0280 Dementia in other diseases classified elsewhere without behavioral disturbance: Secondary | ICD-10-CM

## 2015-09-18 DIAGNOSIS — G309 Alzheimer's disease, unspecified: Secondary | ICD-10-CM

## 2015-09-18 LAB — CBC
HCT: 32 % — ABNORMAL LOW (ref 36.0–46.0)
HEMOGLOBIN: 10.3 g/dL — AB (ref 12.0–15.0)
MCH: 28.6 pg (ref 26.0–34.0)
MCHC: 32.2 g/dL (ref 30.0–36.0)
MCV: 88.9 fL (ref 78.0–100.0)
PLATELETS: 345 10*3/uL (ref 150–400)
RBC: 3.6 MIL/uL — AB (ref 3.87–5.11)
RDW: 17 % — ABNORMAL HIGH (ref 11.5–15.5)
WBC: 12.1 10*3/uL — AB (ref 4.0–10.5)

## 2015-09-18 LAB — BASIC METABOLIC PANEL
ANION GAP: 7 (ref 5–15)
BUN: 12 mg/dL (ref 6–20)
CO2: 27 mmol/L (ref 22–32)
Calcium: 8.6 mg/dL — ABNORMAL LOW (ref 8.9–10.3)
Chloride: 98 mmol/L — ABNORMAL LOW (ref 101–111)
Creatinine, Ser: 0.77 mg/dL (ref 0.44–1.00)
GFR calc Af Amer: >60 mL/min (ref >60)
Glucose, Bld: 128 mg/dL — ABNORMAL HIGH (ref 65–99)
POTASSIUM: 4.8 mmol/L (ref 3.5–5.1)
SODIUM: 132 mmol/L — AB (ref 135–145)

## 2015-09-18 MED ORDER — DULOXETINE HCL 20 MG PO CPEP
20.0000 mg | ORAL_CAPSULE | Freq: Every day | ORAL | Status: DC
  Administered 2015-09-18 – 2015-09-22 (×4): 20 mg via ORAL
  Filled 2015-09-18 (×5): qty 1

## 2015-09-18 MED ORDER — CALCITONIN (SALMON) 200 UNIT/ACT NA SOLN
1.0000 | Freq: Every day | NASAL | Status: DC
  Administered 2015-09-19: 1 via NASAL
  Filled 2015-09-18 (×2): qty 3.7

## 2015-09-18 MED ORDER — PANTOPRAZOLE SODIUM 40 MG PO TBEC
40.0000 mg | DELAYED_RELEASE_TABLET | Freq: Every day | ORAL | Status: DC
  Administered 2015-09-19 – 2015-09-22 (×4): 40 mg via ORAL
  Filled 2015-09-18 (×4): qty 1

## 2015-09-18 MED ORDER — MIRTAZAPINE 15 MG PO TABS
15.0000 mg | ORAL_TABLET | Freq: Every day | ORAL | Status: DC
Start: 2015-09-18 — End: 2015-09-22
  Administered 2015-09-18 – 2015-09-21 (×3): 15 mg via ORAL
  Filled 2015-09-18 (×3): qty 1

## 2015-09-18 MED ORDER — SENNOSIDES-DOCUSATE SODIUM 8.6-50 MG PO TABS
2.0000 | ORAL_TABLET | Freq: Every day | ORAL | Status: DC
  Administered 2015-09-18 – 2015-09-22 (×4): 2 via ORAL
  Filled 2015-09-18 (×4): qty 2

## 2015-09-18 MED ORDER — FAMOTIDINE 20 MG PO TABS
20.0000 mg | ORAL_TABLET | Freq: Every day | ORAL | Status: DC
  Administered 2015-09-18 – 2015-09-22 (×5): 20 mg via ORAL
  Filled 2015-09-18 (×5): qty 1

## 2015-09-18 MED ORDER — LISINOPRIL 5 MG PO TABS
5.0000 mg | ORAL_TABLET | Freq: Every day | ORAL | Status: DC
Start: 2015-09-18 — End: 2015-09-19
  Administered 2015-09-18: 5 mg via ORAL
  Filled 2015-09-18: qty 1

## 2015-09-18 MED ORDER — CALCIUM CARBONATE-VITAMIN D 500-200 MG-UNIT PO TABS
1.0000 | ORAL_TABLET | Freq: Two times a day (BID) | ORAL | Status: DC
  Administered 2015-09-18 – 2015-09-22 (×6): 1 via ORAL
  Filled 2015-09-18 (×6): qty 1

## 2015-09-18 NOTE — Progress Notes (Signed)
Kristen [name] XBD:[id number] DOB: 10-Nov-1935 DOA: 09/17/2015 PCP: [name], MD  Brief narrative: 80 -year-old female with moderate to severe dementia, major depression, reflux, vitamin D deficiency, rheumatoid arthritis, history of recurrent UTIs admitted from [facility] with history of fall and found to have a comminuted displaced intertrochanteric left femoral fracture She cannot tell me any of the history and by report apparently patient had vomiting of dark emesis which was Gastroccult positive  Nursing reports some dark urine as well which looked bloody Her hemoglobin however has remained rock solid stable   Past medical history-As per Problem list Chart reviewed as below-   Consultants:  Ortho  Procedures:  none  Antibiotics:  none   Subjective   Alert but disoriented Nursing reports no specific overnight issues Had stool this am.  Unclear if blood noted or not Tolerating diet just fine    Objective    Interim History:   Telemetry:    Objective: Filed Vitals:   09/17/15 0546 09/17/15 1510 09/17/15 2141 09/18/15 0627  BP: 114/72 120/67 112/73 115/68  Pulse: 90 100 100 93  Temp: 98 F (36.7 C) 98.1 F (36.7 C) 98.5 F (36.9 C) 98.7 F (37.1 C)  TempSrc: Oral Oral Oral Oral  Resp: 18  17 16   SpO2: 93% 96% 93% 96%    Intake/Output Summary (Last 24 hours) at 09/18/15 1300 Last data filed at 09/18/15 0900  Gross per 24 hour  Intake    360 ml  Output    400 ml  Net    -40 ml    Exam:  General: eomi, frail, bitemporalis wasting Cardiovascular:  s1 s2 no m/r/g Respiratory: clear no added sound Abdomen:  Soft nt nd no rebound Skin no le edema, restricted ROM R hip Neuro intact   Data Reviewed: Basic Metabolic Panel:  Recent Labs Lab 09/17/15 0240 09/18/15 0958  NA 131* 132*  K 4.5 4.8  CL 98* 98*  CO2 24 27  GLUCOSE 193* 128*  BUN 17 12  CREATININE 0.79 0.77  CALCIUM 8.6* 8.6*   Liver Function  Tests: No results for input(s): AST, ALT, ALKPHOS, BILITOT, PROT, ALBUMIN in the last 168 hours. No results for input(s): LIPASE, AMYLASE in the last 168 hours. No results for input(s): AMMONIA in the last 168 hours. CBC:  Recent Labs Lab 09/17/15 0240 09/17/15 0446 09/18/15 0339  WBC 14.6*  --  12.1*  NEUTROABS 13.9*  --   --   HGB 11.4* 11.4* 10.3*  HCT 35.0* 35.1* 32.0*  MCV 88.6  --  88.9  PLT 323  --  345   Cardiac Enzymes: No results for input(s): CKTOTAL, CKMB, CKMBINDEX, TROPONINI in the last 168 hours. BNP: Invalid input(s): POCBNP CBG: No results for input(s): GLUCAP in the last 168 hours.  No results found for this or any previous visit (from the past 240 hour(s)).   Studies:              All Imaging reviewed and is as per above notation   Scheduled Meds: . pantoprazole  40 mg Oral Daily   Continuous Infusions:    Assessment/Plan:  Hip fracture EKG benign, chest x-ray benign No overt bleeding  GI bleed, with likely gastritis Likely minimal bleed, hemoglobin stable 10-11 range No evidence hematemesis with soft diet and graduated this today to normal diet 9/29 If tolerates diet could send for surgery in am Keep Ppi  Elbow pain No # on x-ray two-view of the  left elbow done 9/29  HTN Restart lisinopril 5 mg daily  Osteoporosis Continue Os-Cal 500 twice a day, calcitonin presumably for pathologic fractures in the past  Rheumatoid arthritis Continue methotrexate 12.5 every Monday  Appetite, depression Continue Remeron 15 mg daily at bedtime Continue Cymbalta 20 daily    Appt with PCP: Requested Code Status: Full code Family Communication: spoke with sister Kristen [name] on phone Disposition Plan: snf DVT prophylaxis: SCD Consultants: Corky Mull, MD  [facility] Pager [phone] 09/18/2015, 1:00 PM    LOS: 1 day

## 2015-09-18 NOTE — Progress Notes (Signed)
Plan for operative fixation of L hip fracture when medically clear, hopefully tomorrow

## 2015-09-19 ENCOUNTER — Encounter (HOSPITAL_COMMUNITY)
Admission: EM | Disposition: A | Discharge status: Skilled Nursing Facility | Payer: Self-pay | Source: Home / Self Care | Attending: Family Medicine

## 2015-09-19 ENCOUNTER — Inpatient Hospital Stay (HOSPITAL_COMMUNITY): Payer: Medicare Other | Admitting: Anesthesiology

## 2015-09-19 ENCOUNTER — Encounter (HOSPITAL_COMMUNITY): Payer: Self-pay | Admitting: Anesthesiology

## 2015-09-19 ENCOUNTER — Inpatient Hospital Stay (HOSPITAL_COMMUNITY): Payer: Medicare Other

## 2015-09-19 DIAGNOSIS — S72102A Unspecified trochanteric fracture of left femur, initial encounter for closed fracture: Secondary | ICD-10-CM | POA: Diagnosis present

## 2015-09-19 HISTORY — PX: INTRAMEDULLARY (IM) NAIL INTERTROCHANTERIC: SHX5875

## 2015-09-19 LAB — CBC WITH DIFFERENTIAL/PLATELET
BASOS ABS: 0 10*3/uL (ref 0.0–0.1)
BASOS PCT: 0 %
Eosinophils Absolute: 0 10*3/uL (ref 0.0–0.7)
Eosinophils Relative: 0 %
HEMATOCRIT: 28.4 % — AB (ref 36.0–46.0)
HEMOGLOBIN: 9.3 g/dL — AB (ref 12.0–15.0)
LYMPHS PCT: 6 %
Lymphs Abs: 0.8 10*3/uL (ref 0.7–4.0)
MCH: 28.8 pg (ref 26.0–34.0)
MCHC: 32.7 g/dL (ref 30.0–36.0)
MCV: 87.9 fL (ref 78.0–100.0)
MONO ABS: 0.6 10*3/uL (ref 0.1–1.0)
MONOS PCT: 5 %
NEUTROS ABS: 12.2 10*3/uL — AB (ref 1.7–7.7)
NEUTROS PCT: 89 %
Platelets: 304 10*3/uL (ref 150–400)
RBC: 3.23 MIL/uL — ABNORMAL LOW (ref 3.87–5.11)
RDW: 16.6 % — ABNORMAL HIGH (ref 11.5–15.5)
WBC: 13.6 10*3/uL — ABNORMAL HIGH (ref 4.0–10.5)

## 2015-09-19 LAB — SURGICAL PCR SCREEN
MRSA, PCR: NEGATIVE
STAPHYLOCOCCUS AUREUS: NEGATIVE

## 2015-09-19 SURGERY — FIXATION, FRACTURE, INTERTROCHANTERIC, WITH INTRAMEDULLARY ROD
Anesthesia: General | Laterality: Left

## 2015-09-19 SURGERY — FIXATION, FRACTURE, INTERTROCHANTERIC, WITH INTRAMEDULLARY ROD
Anesthesia: General | Site: Hip | Laterality: Left

## 2015-09-19 MED ORDER — VANCOMYCIN HCL IN DEXTROSE 1-5 GM/200ML-% IV SOLN
1000.0000 mg | Freq: Two times a day (BID) | INTRAVENOUS | Status: AC
  Administered 2015-09-20: 1000 mg via INTRAVENOUS
  Filled 2015-09-19: qty 200

## 2015-09-19 MED ORDER — SODIUM CHLORIDE 0.9 % IV SOLN
INTRAVENOUS | Status: DC
  Administered 2015-09-19 – 2015-09-21 (×3): via INTRAVENOUS

## 2015-09-19 MED ORDER — VANCOMYCIN HCL 1000 MG IV SOLR
1000.0000 mg | INTRAVENOUS | Status: DC | PRN
  Administered 2015-09-19: 1000 mg via INTRAVENOUS

## 2015-09-19 MED ORDER — METOCLOPRAMIDE HCL 5 MG PO TABS: 5.0000 mg | ORAL_TABLET | Freq: Three times a day (TID) | ORAL | Status: DC | PRN

## 2015-09-19 MED ORDER — LACTATED RINGERS IV SOLN
INTRAVENOUS | Status: DC | PRN
  Administered 2015-09-19: 14:00:00 via INTRAVENOUS

## 2015-09-19 MED ORDER — PHENYLEPHRINE HCL 10 MG/ML IJ SOLN
INTRAMUSCULAR | Status: DC | PRN
  Administered 2015-09-19: 320 ug via INTRAVENOUS

## 2015-09-19 MED ORDER — ROCURONIUM BROMIDE 100 MG/10ML IV SOLN
INTRAVENOUS | Status: DC | PRN
  Administered 2015-09-19: 30 mg via INTRAVENOUS

## 2015-09-19 MED ORDER — VANCOMYCIN HCL IN DEXTROSE 1-5 GM/200ML-% IV SOLN
INTRAVENOUS | Status: AC
  Filled 2015-09-19: qty 200

## 2015-09-19 MED ORDER — PROPOFOL 10 MG/ML IV BOLUS
INTRAVENOUS | Status: DC | PRN
  Administered 2015-09-19: 50 mg via INTRAVENOUS

## 2015-09-19 MED ORDER — SUGAMMADEX SODIUM 200 MG/2ML IV SOLN
INTRAVENOUS | Status: AC
  Filled 2015-09-19: qty 2

## 2015-09-19 MED ORDER — ASPIRIN EC 325 MG PO TBEC
325.0000 mg | DELAYED_RELEASE_TABLET | Freq: Two times a day (BID) | ORAL | Status: DC
  Administered 2015-09-20 – 2015-09-22 (×3): 325 mg via ORAL
  Filled 2015-09-19 (×3): qty 1

## 2015-09-19 MED ORDER — ONDANSETRON HCL 4 MG/2ML IJ SOLN: 4.0000 mg | Freq: Four times a day (QID) | INTRAMUSCULAR | Status: DC | PRN

## 2015-09-19 MED ORDER — LACTATED RINGERS IV SOLN
INTRAVENOUS | Status: DC
  Administered 2015-09-19: 50 mL/h via INTRAVENOUS

## 2015-09-19 MED ORDER — 0.9 % SODIUM CHLORIDE (POUR BTL) OPTIME
TOPICAL | Status: DC | PRN
  Administered 2015-09-19: 1000 mL

## 2015-09-19 MED ORDER — ONDANSETRON HCL 4 MG PO TABS: 4.0000 mg | ORAL_TABLET | Freq: Four times a day (QID) | ORAL | Status: DC | PRN

## 2015-09-19 MED ORDER — MENTHOL 3 MG MT LOZG: 1.0000 | LOZENGE | OROMUCOSAL | Status: DC | PRN

## 2015-09-19 MED ORDER — SUGAMMADEX SODIUM 200 MG/2ML IV SOLN
INTRAVENOUS | Status: DC | PRN
  Administered 2015-09-19: 100 mg via INTRAVENOUS

## 2015-09-19 MED ORDER — ACETAMINOPHEN 325 MG PO TABS
650.0000 mg | ORAL_TABLET | Freq: Four times a day (QID) | ORAL | Status: DC | PRN
  Administered 2015-09-20 – 2015-09-21 (×4): 650 mg via ORAL
  Filled 2015-09-19 (×4): qty 2

## 2015-09-19 MED ORDER — FENTANYL CITRATE (PF) 100 MCG/2ML IJ SOLN
INTRAMUSCULAR | Status: DC | PRN
  Administered 2015-09-19: 50 ug via INTRAVENOUS

## 2015-09-19 MED ORDER — CLINDAMYCIN PHOSPHATE 600 MG/50ML IV SOLN
600.0000 mg | Freq: Once | INTRAVENOUS | Status: AC
  Administered 2015-09-19: 600 mg via INTRAVENOUS
  Filled 2015-09-19: qty 50

## 2015-09-19 MED ORDER — SODIUM CHLORIDE 0.9 % IV SOLN
10.0000 mg | INTRAVENOUS | Status: DC | PRN
  Administered 2015-09-19: 40 ug/min via INTRAVENOUS

## 2015-09-19 MED ORDER — ACETAMINOPHEN 650 MG RE SUPP: 650.0000 mg | Freq: Four times a day (QID) | RECTAL | Status: DC | PRN

## 2015-09-19 MED ORDER — PHENOL 1.4 % MT LIQD: 1.0000 | OROMUCOSAL | Status: DC | PRN

## 2015-09-19 MED ORDER — LIDOCAINE HCL (CARDIAC) 20 MG/ML IV SOLN
INTRAVENOUS | Status: DC | PRN
  Administered 2015-09-19: 40 mg via INTRAVENOUS

## 2015-09-19 MED ORDER — ONDANSETRON HCL 4 MG/2ML IJ SOLN
INTRAMUSCULAR | Status: DC | PRN
  Administered 2015-09-19: 4 mg via INTRAVENOUS

## 2015-09-19 MED ORDER — FENTANYL CITRATE (PF) 250 MCG/5ML IJ SOLN
INTRAMUSCULAR | Status: AC
  Filled 2015-09-19: qty 5

## 2015-09-19 MED ORDER — FENTANYL CITRATE (PF) 100 MCG/2ML IJ SOLN: 25.0000 ug | INTRAMUSCULAR | Status: DC | PRN

## 2015-09-19 MED ORDER — METOCLOPRAMIDE HCL 5 MG/ML IJ SOLN: 5.0000 mg | Freq: Three times a day (TID) | INTRAMUSCULAR | Status: DC | PRN

## 2015-09-19 SURGICAL SUPPLY — 43 items
ALCOHOL ISOPROPYL (RUBBING) (MISCELLANEOUS) ×3 IMPLANT
BLADE TFNA HELICAL 110 (Anchor) ×2 IMPLANT
BLADE TFNA HELICAL 110MM (Anchor) ×1 IMPLANT
BNDG COHESIVE 4X5 TAN STRL (GAUZE/BANDAGES/DRESSINGS) IMPLANT
CHLORAPREP W/TINT 26ML (MISCELLANEOUS) ×3 IMPLANT
COVER PERINEAL POST (MISCELLANEOUS) ×3 IMPLANT
COVER SURGICAL LIGHT HANDLE (MISCELLANEOUS) ×3 IMPLANT
DERMABOND ADVANCED (GAUZE/BANDAGES/DRESSINGS) ×2
DERMABOND ADVANCED .7 DNX12 (GAUZE/BANDAGES/DRESSINGS) ×1 IMPLANT
DRAPE C-ARM 42X72 X-RAY (DRAPES) ×3 IMPLANT
DRAPE C-ARMOR (DRAPES) ×3 IMPLANT
DRAPE IMP U-DRAPE 54X76 (DRAPES) ×6 IMPLANT
DRAPE STERI IOBAN 125X83 (DRAPES) ×3 IMPLANT
DRAPE U-SHAPE 47X51 STRL (DRAPES) ×6 IMPLANT
DRAPE UNIVERSAL PACK (DRAPES) ×3 IMPLANT
DRSG MEPILEX BORDER 4X4 (GAUZE/BANDAGES/DRESSINGS) ×3 IMPLANT
DRSG PAD ABDOMINAL 8X10 ST (GAUZE/BANDAGES/DRESSINGS) IMPLANT
ELECT REM PT RETURN 9FT ADLT (ELECTROSURGICAL) ×3
ELECTRODE REM PT RTRN 9FT ADLT (ELECTROSURGICAL) ×1 IMPLANT
FACESHIELD WRAPAROUND (MASK) ×3 IMPLANT
GLOVE BIOGEL PI IND STRL 8.5 (GLOVE) ×2 IMPLANT
GLOVE BIOGEL PI INDICATOR 8.5 (GLOVE) ×4
GLOVE SURG ORTHO 8.5 STRL (GLOVE) ×3 IMPLANT
GOWN STRL REUS W/ TWL LRG LVL3 (GOWN DISPOSABLE) ×2 IMPLANT
GOWN STRL REUS W/TWL 2XL LVL3 (GOWN DISPOSABLE) ×3 IMPLANT
GOWN STRL REUS W/TWL LRG LVL3 (GOWN DISPOSABLE) ×4
KIT ROOM TURNOVER OR (KITS) ×3 IMPLANT
MANIFOLD NEPTUNE II (INSTRUMENTS) ×3 IMPLANT
NAIL TROCH FIX LNG 11X360LT (Nail) ×3 IMPLANT
NS IRRIG 1000ML POUR BTL (IV SOLUTION) ×3 IMPLANT
PACK GENERAL/GYN (CUSTOM PROCEDURE TRAY) ×3 IMPLANT
PAD ARMBOARD 7.5X6 YLW CONV (MISCELLANEOUS) ×6 IMPLANT
PADDING CAST ABS 4INX4YD NS (CAST SUPPLIES)
PADDING CAST ABS COTTON 4X4 ST (CAST SUPPLIES) IMPLANT
REAMER ROD DEEP FLUTE 2.5X950 (INSTRUMENTS) ×3 IMPLANT
SCREW LOCK T25 FT 36X5X4.3X (Screw) ×1 IMPLANT
SCREW LOCKING 5.0X36MM (Screw) ×2 IMPLANT
SUT MNCRL AB 3-0 PS2 27 (SUTURE) ×3 IMPLANT
SUT MON AB 2-0 CT1 27 (SUTURE) ×3 IMPLANT
SUT VIC AB 1 CT1 27 (SUTURE) ×2
SUT VIC AB 1 CT1 27XBRD ANBCTR (SUTURE) ×1 IMPLANT
TOWEL OR 17X24 6PK STRL BLUE (TOWEL DISPOSABLE) ×3 IMPLANT
TOWEL OR 17X26 10 PK STRL BLUE (TOWEL DISPOSABLE) ×3 IMPLANT

## 2015-09-19 NOTE — Transfer of Care (Signed)
Immediate Anesthesia Transfer of Care Note  Patient: Kristen [name]  Procedure(s) Performed: Procedure(s): INTRAMEDULLARY (IM) NAIL INTERTROCHANTRIC (Left)  Patient Location: PACU  Anesthesia Type:General  Level of Consciousness: sedated and responds to stimulation  Airway & Oxygen Therapy: Patient Spontanous Breathing and Patient connected to nasal cannula oxygen  Post-op Assessment: Report given to RN and Post -op Vital signs reviewed and stable  Post vital signs: Reviewed and stable  Last Vitals:  Filed Vitals:   09/19/15 1157  BP: 109/59  Pulse: 107  Temp: 36.7 C  Resp: 16    Complications: No apparent anesthesia complications

## 2015-09-19 NOTE — Brief Op Note (Signed)
09/17/2015 - 09/19/2015  3:44 PM  PATIENT:  Kristen [name]  80 y.o. female  PRE-OPERATIVE DIAGNOSIS:  Left Intertroch Femoral fracture  POST-OPERATIVE DIAGNOSIS:  Left Intertroch Femoral fracture  PROCEDURE:  Procedure(s): INTRAMEDULLARY (IM) NAIL INTERTROCHANTRIC (Left)  SURGEON:  Surgeon(s) and Role:    * Samson Frederic, MD - Primary  PHYSICIAN ASSISTANT:   ASSISTANTS: April Greene, RNFA   ANESTHESIA:   general  EBL:  Total I/O In: 0  Out: 300 [Urine:200; Blood:100]  BLOOD ADMINISTERED:none  DRAINS: none   LOCAL MEDICATIONS USED:  NONE  SPECIMEN:  No Specimen  DISPOSITION OF SPECIMEN:  N/A  COUNTS:  YES  TOURNIQUET:  * No tourniquets in log *  DICTATION: .Other Dictation: Dictation Number [id number]  PLAN OF CARE: Admit to inpatient   PATIENT DISPOSITION:  PACU - hemodynamically stable.   Delay start of Pharmacological VTE agent (>24hrs) due to surgical blood loss or risk of bleeding: not applicable

## 2015-09-19 NOTE — H&P (View-Only) (Signed)
 ORTHOPAEDIC CONSULTATION  REQUESTING PHYSICIAN: Jai-Gurmukh Samtani, MD  PCP:  PANDEY, MAHIMA, MD  Chief Complaint: left hip pain  HPI: Kristen [name] is a 80 y.o. female who complains of  Left hip pain and inability to ambulate. Nursing home resident with advanced dementia who was found down last night at her facility. Denies other injuries. States she normally ambulates in her room with a walker.  Past Medical History  Diagnosis Date  . RA (rheumatoid arthritis) 05/15/2013  . HTN (hypertension) 05/15/2013  . GERD (gastroesophageal reflux disease) 05/15/2013  . COPD (chronic obstructive pulmonary disease) 05/15/2013  . Unspecified constipation 05/15/2013  . UTI (urinary tract infection) 05/15/2013  . HCAP (healthcare-associated pneumonia) 01/13/2014   History reviewed. No pertinent past surgical history. Social History   Social History  . Marital Status: Single    Spouse Name: N/A  . Number of Children: N/A  . Years of Education: N/A   Social History Main Topics  . Smoking status: Former Smoker  . Smokeless tobacco: None  . Alcohol Use: No  . Drug Use: No  . Sexual Activity: Not Currently   Other Topics Concern  . None   Social History Narrative   History reviewed. No pertinent family history. Allergies  Allergen Reactions  . Erythromycin Base     Per MAR  . Penicillins     Per MAR   Prior to Admission medications   Medication Sig Start Date End Date Taking? Authorizing Provider  acetaminophen (TYLENOL) 325 MG tablet Take 650 mg by mouth every 4 (four) hours as needed (For fever greater than 99.5).   Yes Historical Provider, MD  acetaminophen (TYLENOL) 500 MG tablet Take 500 mg by mouth 2 (two) times daily. 0900 & 1700   Yes Edwina Berry, NP  calcitonin, salmon, (MIACALCIN/FORTICAL) 200 UNIT/ACT nasal spray Place 1 spray into alternate nostrils daily.   Yes Historical Provider, MD  calcium-vitamin D (OSCAL WITH D) 500-200 MG-UNIT per tablet Take 1 tablet by mouth 2  (two) times daily.    Yes Historical Provider, MD  cholecalciferol (VITAMIN D) 1000 UNITS tablet Take 1,000 Units by mouth daily.   Yes Historical Provider, MD  Cranberry 200 MG CAPS Take 400 mg by mouth 2 (two) times daily.   Yes Historical Provider, MD  DULoxetine (CYMBALTA) 20 MG capsule Take 20 mg by mouth daily.   Yes Historical Provider, MD  ferrous sulfate 220 (44 FE) MG/5ML solution Take 325 mg by mouth 2 (two) times daily with a meal.   Yes Historical Provider, MD  folic acid (FOLVITE) 1 MG tablet Take 1 mg by mouth 2 (two) times daily. 0800 & 1700   Yes Edwina Berry, NP  lisinopril (PRINIVIL,ZESTRIL) 5 MG tablet Take 5 mg by mouth daily.   Yes Historical Provider, MD  Menthol, Topical Analgesic, (BIOFREEZE) 4 % GEL Apply topically every 6 (six) hours.   Yes Historical Provider, MD  methotrexate (RHEUMATREX) 2.5 MG tablet Take 12.5 mg by mouth once a week. mondays   Yes Edwina Berry, NP  mirtazapine (REMERON) 15 MG tablet Take 15 mg by mouth at bedtime.   Yes Historical Provider, MD  polyethylene glycol (MIRALAX / GLYCOLAX) packet Take 17 g by mouth every other day.   Yes Historical Provider, MD  ranitidine (ZANTAC) 150 MG tablet Take 150 mg by mouth 2 (two) times daily.   Yes Historical Provider, MD  sennosides-docusate sodium (SENOKOT-S) 8.6-50 MG tablet Take 2 tablets by mouth daily.   Yes Historical Provider, MD    traMADol (ULTRAM) 50 MG tablet Take 25 mg by mouth every 12 (twelve) hours as needed for moderate pain.   Yes Historical Provider, MD  UNABLE TO FIND Take 120 mLs by mouth 3 (three) times daily. Med Name: MedPass   Yes Historical Provider, MD  lidocaine (LIDODERM) 5 % Place 1 patch onto the skin daily. Remove & Discard patch within 12 hours to her lower back Patient not taking: Reported on 09/17/2015 01/11/14   [name], NP   Dg Chest 1 View  09/17/2015   CLINICAL DATA:  Unwitnessed fall.  EXAM: CHEST 1 VIEW  COMPARISON:  03/20/2014  FINDINGS: Lower lung volumes from  prior exam. Cardiomediastinal contours are unchanged allowing for differences in technique and patient rotation. There is linear subsegmental atelectasis at the right lung base. No consolidation to suggest pneumonia. No pleural effusion or pneumothorax. Probable compression deformities of lower thoracic spine.  IMPRESSION: Hypoventilatory chest with subsegmental atelectasis at the right lung base.   Electronically Signed   By: Rubye Oaks M.D.   On: 09/17/2015 04:11   Ct Head Wo Contrast  09/17/2015   CLINICAL DATA:  Unwitnessed fall just prior to arrival. Fall out of bed this evening.  EXAM: CT HEAD WITHOUT CONTRAST  TECHNIQUE: Contiguous axial images were obtained from the base of the skull through the vertex without intravenous contrast.  COMPARISON:  08/11/2013  FINDINGS: Diffuse cerebellar and cerebral atrophy. Advanced chronic small vessel ischemic change.No intracranial hemorrhage, mass effect, or midline shift. No hydrocephalus. The basilar cisterns are patent. No evidence of territorial infarct. No intracranial fluid collection. Calvarium is intact. Chronic sphenoid sinus disease, with near complete opacification of the left-sided sphenoid sinus and central calcification. Remaining paranasal sinuses are well-aerated. The mastoid air cells are well aerated.  IMPRESSION: 1.  No acute intracranial abnormality. 2. Atrophy and advanced chronic small vessel ischemia.   Electronically Signed   By: Rubye Oaks M.D.   On: 09/17/2015 03:10   Dg Hip Unilat With Pelvis 2-3 Views Left  09/17/2015   CLINICAL DATA:  80 year old female post unwitnessed fall, now with left hip pain.  EXAM: DG HIP (WITH OR WITHOUT PELVIS) 2-3V LEFT  COMPARISON:  None.  FINDINGS: There is a comminuted displaced fracture of the intertrochanteric left hip involving the lesser and greater trochanters. Mild proximal migration of the femoral shaft. The femoral head remains seated in the acetabulum. No evidence of additional  fracture. The bones are under mineralized.  IMPRESSION: Comminuted displaced intertrochanteric left hip fracture.   Electronically Signed   By: Rubye Oaks M.D.   On: 09/17/2015 03:07   Dg Femur Min 2 Views Left  09/17/2015   CLINICAL DATA:  Evaluate hip fracture  EXAM: LEFT FEMUR 2 VIEWS  COMPARISON:  Pelvis radiography from earlier today  FINDINGS: Limited study due to patient condition.  There is an acute intertrochanteric left femur fracture with varus angulation and proximal migration of the distal fragment. Fracture is comminuted with isolated lesser trochanter fragment. Femoral head is located. No additional fracture is seen. Osteopenia without evidence of focal bone lesion.  IMPRESSION: Comminuted, displaced intertrochanteric left femur fracture.   Electronically Signed   By: Marnee Spring M.D.   On: 09/17/2015 04:32    Positive ROS: All other systems have been reviewed and were otherwise negative with the exception of those mentioned in the HPI and as above.  Physical Exam: General: Alert, no acute distress Cardiovascular: No pedal edema Respiratory: No cyanosis, no use of accessory musculature  GI: No organomegaly, abdomen is soft and non-tender Skin: No lesions in the area of chief complaint Neurologic: Sensation intact distally Psychiatric: Patient is competent for consent with normal mood and affect Lymphatic: No axillary or cervical lymphadenopathy  MUSCULOSKELETAL: LLE shortened and externally rotated. Pain with log rolling. +TA/GS/EHL. 2+ DP. SILT.  Assessment: L IT femur fx  Plan: Needs operative fixation for pain control and to mobilize Needs perioperative risk stratification / medical optimization by primary team Plan for OR later today   Garnet Koyanagi, MD Cell [phone]    09/17/2015 7:40 AM    ADDENDUM: primary team worried about possible GI bleed, therefore not cleared for surgery. Will follow.

## 2015-09-19 NOTE — Anesthesia Procedure Notes (Signed)
Procedure Name: Intubation Date/Time: 09/19/2015 2:37 PM Performed by: Wray Kearns A Pre-anesthesia Checklist: Patient identified, Emergency Drugs available, Suction available, Patient being monitored and Timeout performed Patient Re-evaluated:Patient Re-evaluated prior to inductionOxygen Delivery Method: Circle system utilized Preoxygenation: Pre-oxygenation with 100% oxygen Intubation Type: IV induction and Cricoid Pressure applied Ventilation: Mask ventilation without difficulty Laryngoscope Size: Mac and 3 Grade View: Grade II Tube type: Oral Tube size: 7.5 mm Number of attempts: 1 Airway Equipment and Method: Stylet Placement Confirmation: ETT inserted through vocal cords under direct vision,  positive ETCO2 and breath sounds checked- equal and bilateral Secured at: 21 cm Tube secured with: Tape Dental Injury: Teeth and Oropharynx as per pre-operative assessment

## 2015-09-19 NOTE — Progress Notes (Signed)
Called r/t CBC - requested draw ASAP for results r/t probably surgery Lt hip Fx early afternoon.

## 2015-09-19 NOTE — Anesthesia Preprocedure Evaluation (Addendum)
Anesthesia Evaluation  Patient identified by MRN, date of birth, ID band Patient confused    Reviewed: Allergy & Precautions, H&P , NPO status , Patient's Chart, lab work & pertinent test results  Airway Mallampati: II  TM Distance: >3 FB Neck ROM: Full    Dental no notable dental hx. (+) Partial Upper, Edentulous Lower, Dental Advisory Given   Pulmonary neg pulmonary ROS, COPD, former smoker,    Pulmonary exam normal breath sounds clear to auscultation       Cardiovascular hypertension, Pt. on medications negative cardio ROS   Rhythm:Regular Rate:Normal     Neuro/Psych Depression Dementia negative psych ROS   GI/Hepatic negative GI ROS, Neg liver ROS, GERD  Medicated,  Endo/Other  negative endocrine ROS  Renal/GU negative Renal ROS  negative genitourinary   Musculoskeletal  (+) Arthritis , Rheumatoid disorders,    Abdominal   Peds  Hematology negative hematology ROS (+)   Anesthesia Other Findings   Reproductive/Obstetrics negative OB ROS                           Anesthesia Physical Anesthesia Plan  ASA: III  Anesthesia Plan: General   Post-op Pain Management:    Induction: Intravenous  Airway Management Planned: Oral ETT  Additional Equipment:   Intra-op Plan:   Post-operative Plan: Extubation in OR  Informed Consent: I have reviewed the patients History and Physical, chart, labs and discussed the procedure including the risks, benefits and alternatives for the proposed anesthesia with the patient or authorized representative who has indicated his/her understanding and acceptance.   Dental advisory given  Plan Discussed with: CRNA  Anesthesia Plan Comments:         Anesthesia Quick Evaluation

## 2015-09-19 NOTE — Discharge Instructions (Signed)
Dr. Samson Frederic Hip & Knee Specialist [facility] [address], [address] [place], [place] [address] [phone]   POSTOPERATIVE DIRECTIONS   Hip Rehabilitation, Guidelines Following Surgery   WEIGHT BEARING Weight bearing as tolerated with assist device (walker, cane, etc) as directed, use it as long as suggested by your surgeon or therapist, typically at least 4-6 weeks.  The results of a hip operation are greatly improved after range of motion and muscle strengthening exercises. Follow all safety measures which are given to protect your hip. If any of these exercises cause increased pain or swelling in your joint, decrease the amount until you are comfortable again. Then slowly increase the exercises. Call your caregiver if you have problems or questions.   RANGE OF MOTION AND STRENGTHENING EXERCISES  These exercises are designed to help you keep full movement of your hip joint. Follow your caregiver's or physical therapist's instructions. Perform all exercises about fifteen times, three times per day or as directed. Exercise both hips, even if you have had only one joint replacement. These exercises can be done on a training (exercise) mat, on the floor, on a table or on a bed. Use whatever works the best and is most comfortable for you. Use music or television while you are exercising so that the exercises are a pleasant break in your day. This will make your life better with the exercises acting as a break in routine you can look forward to.  Lying on your back, slowly slide your foot toward your buttocks, raising your knee up off the floor. Then slowly slide your foot back down until your leg is straight again.  Lying on your back spread your legs as far apart as you can without causing discomfort.  Lying on your side, raise your upper leg and foot straight up from the floor as far as is comfortable. Slowly lower the leg and repeat.  Lying on your back, tighten up the  muscle in the front of your thigh (quadriceps muscles). You can do this by keeping your leg straight and trying to raise your heel off the floor. This helps strengthen the largest muscle supporting your knee.  Lying on your back, tighten up the muscles of your buttocks both with the legs straight and with the knee bent at a comfortable angle while keeping your heel on the floor.   SKILLED REHAB INSTRUCTIONS: If the patient is transferred to a skilled rehab facility following release from the hospital, a list of the current medications will be sent to the facility for the patient to continue.  When discharged from the skilled rehab facility, please have the facility set up the patient's Home Health Physical Therapy prior to being released. Also, the skilled facility will be responsible for providing the patient with their medications at time of release from the facility to include their pain medication and their blood thinner medication. If the patient is still at the rehab facility at time of the two week follow up appointment, the skilled rehab facility will also need to assist the patient in arranging follow up appointment in our office and any transportation needs.  MAKE SURE YOU:  Understand these instructions.  Will watch your condition.  Will get help right away if you are not doing well or get worse.  Pick up stool softner and laxative for home use following surgery while on pain medications. Change dressing as needed. Continue to use ice for pain and swelling after surgery. Do not use any  lotions or creams on the incision until instructed by your surgeon.

## 2015-09-19 NOTE — Interval H&P Note (Signed)
History and Physical Interval Note:  09/19/2015 1:53 PM  Kristen [name]  has presented today for surgery, with the diagnosis of Left Intertroch Femoral fracture  The various methods of treatment have been discussed with the patient and family. After consideration of risks, benefits and other options for treatment, the patient has consented to  Procedure(s): INTRAMEDULLARY (IM) NAIL INTERTROCHANTRIC (Left) as a surgical intervention .  The patient's history has been reviewed, patient examined, no change in status, stable for surgery.  I have reviewed the patient's chart and labs.  Questions were answered to the patient's satisfaction.     The risks, benefits, and alternatives were discussed with the patient and her family. There are risks associated with the surgery including, but not limited to, problems with anesthesia (death), infection, instability (giving out of the joint), differences in leg length/angulation/rotation, fracture of bones, loosening or failure of implants, hematoma (blood accumulation) which may require surgical drainage, blood clots, pulmonary embolism, nerve injury (foot drop), and blood vessel injury. The patient/family understands these risks and elects to proceed.    Ladarrell Cornwall, Cloyde Reams

## 2015-09-19 NOTE — Progress Notes (Signed)
ZENDAYA GROSECLOSE FOY:[id number] DOB: January 15, 1935 DOA: 09/17/2015 PCP: [name], MD  Brief narrative: 80 -year-old female with moderate to severe dementia, major depression, reflux, vitamin D deficiency, rheumatoid arthritis, history of recurrent UTIs admitted from [facility] health and rehabilitation with history of fall and found to have a comminuted displaced intertrochanteric left femoral fracture She cannot tell me any of the history and by report apparently patient had vomiting of dark emesis which was Gastroccult positive  Nursing reports some dark urine as well which looked bloody She ultimately underwent operative fixation of the hip on 9/30   Past medical history-As per Problem list Chart reviewed as below-   Consultants:  Ortho  Procedures:  none  Antibiotics:  none   slept poorly overnight  family at bedside   nursing reports no dark stool tarry stool or vomiting blood no other issues   Objective    Interim History:   Telemetry:    Objective: Filed Vitals:   09/19/15 1157 09/19/15 1608 09/19/15 1638 09/19/15 1653  BP: 109/59  116/58 122/65  Pulse: 107  95 97  Temp: 98.1 F (36.7 C) 99.3 F (37.4 C)    TempSrc: Oral     Resp: 16  17 17   SpO2: 94%  99% 100%    Intake/Output Summary (Last 24 hours) at 09/19/15 1729 Last data filed at 09/19/15 1608  Gross per 24 hour  Intake    880 ml  Output    550 ml  Net    330 ml    Exam:  General: eomi, frail, bitemporalis wasting Cardiovascular:  s1 s2 no m/r/g Respiratory: clear no added sound Abdomen:  Soft nt nd no rebound Skin no le edema, restricted ROM R hip Neuro intact   Data Reviewed: Basic Metabolic Panel:  Recent Labs Lab 09/17/15 0240 09/18/15 0958  NA 131* 132*  K 4.5 4.8  CL 98* 98*  CO2 24 27  GLUCOSE 193* 128*  BUN 17 12  CREATININE 0.79 0.77  CALCIUM 8.6* 8.6*   Liver Function Tests: No results for input(s): AST, ALT, ALKPHOS, BILITOT, PROT, ALBUMIN in the last  168 hours. No results for input(s): LIPASE, AMYLASE in the last 168 hours. No results for input(s): AMMONIA in the last 168 hours. CBC:  Recent Labs Lab 09/17/15 0240 09/17/15 0446 09/18/15 0339 09/19/15 0935  WBC 14.6*  --  12.1* 13.6*  NEUTROABS 13.9*  --   --  12.2*  HGB 11.4* 11.4* 10.3* 9.3*  HCT 35.0* 35.1* 32.0* 28.4*  MCV 88.6  --  88.9 87.9  PLT 323  --  345 304   Cardiac Enzymes: No results for input(s): CKTOTAL, CKMB, CKMBINDEX, TROPONINI in the last 168 hours. BNP: Invalid input(s): POCBNP CBG: No results for input(s): GLUCAP in the last 168 hours.  Recent Results (from the past 240 hour(s))  Surgical pcr screen     Status: None   Collection Time: 09/19/15 11:33 AM  Result Value Ref Range Status   MRSA, PCR NEGATIVE NEGATIVE Final   Staphylococcus aureus NEGATIVE NEGATIVE Final    Comment:        The Xpert SA Assay (FDA approved for NASAL specimens in patients over 56 years of age), is one component of a comprehensive surveillance program.  Test performance has been validated by [facility] for patients greater than or equal to 79 year old. It is not intended to diagnose infection nor to guide or monitor treatment.      Studies:  All Imaging reviewed and is as per above notation   Scheduled Meds: . [MAR Hold] calcitonin (salmon)  1 spray Alternating Nares Daily  . [MAR Hold] calcium-vitamin D  1 tablet Oral BID  . [MAR Hold] DULoxetine  20 mg Oral Daily  . [MAR Hold] famotidine  20 mg Oral Daily  . [MAR Hold] lisinopril  5 mg Oral Daily  . [MAR Hold] mirtazapine  15 mg Oral QHS  . [MAR Hold] pantoprazole  40 mg Oral Daily  . [MAR Hold] senna-docusate  2 tablet Oral Daily   Continuous Infusions: . sodium chloride 50 mL/hr at 09/19/15 1226  . lactated ringers 50 mL/hr (09/19/15 1345)     Assessment/Plan:  Hip fracture EKG benign, chest x-ray benign No overt bleeding  would delay aspirin by 1 day and ensure that she is eating  normally prior to administering the same  GI bleed, with likely gastritis Likely minimal bleed, hemoglobin stable 10-11 range No evidence hematemesis with soft diet and graduated this today to normal diet 9/29 Continue EPI post surgery  Elbow pain No # on x-ray two-view of the left elbow done 9/29  HTN Slightly low blood pressures noted so hold  lisinopril 5 mg daily  Osteoporosis Continue Os-Cal 500 twice a day, calcitonin presumably for pathologic fractures in the past  Rheumatoid arthritis Continue methotrexate 12.5 every Monday  Appetite, depression Continue Remeron 15 mg daily at bedtime Continue Cymbalta 20 daily    Appt with PCP: Requested Code Status: Full code Family Communicatiodiscussed in person with sister at the bedside  Disposition Plan: snf DVT prophylaxis: SCD Consultants: Corky Mull, MD  [facility] Pager [phone] 09/19/2015, 5:29 PM    LOS: 2 days

## 2015-09-19 NOTE — Progress Notes (Signed)
Earlier text paged Dr. Mahala Menghini - sister concerned she seems much sleepier today. Order to start IV NS @ 50 cc/ hr received and initiated. Additionally gave Protonix and Pepcid PO with sips of H2O.

## 2015-09-20 DIAGNOSIS — M05711 Rheumatoid arthritis with rheumatoid factor of right shoulder without organ or systems involvement: Secondary | ICD-10-CM

## 2015-09-20 LAB — BASIC METABOLIC PANEL
Anion gap: 11 (ref 5–15)
BUN: 8 mg/dL (ref 6–20)
CHLORIDE: 93 mmol/L — AB (ref 101–111)
CO2: 25 mmol/L (ref 22–32)
Calcium: 7.9 mg/dL — ABNORMAL LOW (ref 8.9–10.3)
Creatinine, Ser: 0.82 mg/dL (ref 0.44–1.00)
GFR calc Af Amer: >60 mL/min (ref >60)
GLUCOSE: 109 mg/dL — AB (ref 65–99)
POTASSIUM: 4 mmol/L (ref 3.5–5.1)
Sodium: 129 mmol/L — ABNORMAL LOW (ref 135–145)

## 2015-09-20 LAB — CBC
HCT: 25.5 % — ABNORMAL LOW (ref 36.0–46.0)
Hemoglobin: 8.4 g/dL — ABNORMAL LOW (ref 12.0–15.0)
MCH: 29 pg (ref 26.0–34.0)
MCHC: 32.9 g/dL (ref 30.0–36.0)
MCV: 87.9 fL (ref 78.0–100.0)
PLATELETS: 269 10*3/uL (ref 150–400)
RBC: 2.9 MIL/uL — AB (ref 3.87–5.11)
RDW: 16.3 % — ABNORMAL HIGH (ref 11.5–15.5)
WBC: 9.5 10*3/uL (ref 4.0–10.5)

## 2015-09-20 NOTE — Progress Notes (Signed)
Patient ID: CLEA DUBACH, female   DOB: [birth year], 80 y.o.   MRN: [id number] Subjective: 1 Day Post-Op Procedure(s) (LRB): INTRAMEDULLARY (IM) NAIL INTERTROCHANTRIC (Left)    Patient with advanced dementia.  Sleeping comfortably this am  Objective:   VITALS:   Filed Vitals:   09/20/15 0611  BP: 95/56  Pulse: 112  Temp: 98.2 F (36.8 C)  Resp: 17    Incision: dressing C/D/I, left hip, thigh  LABS  Recent Labs  09/18/15 0339 09/19/15 0935  HGB 10.3* 9.3*  HCT 32.0* 28.4*  WBC 12.1* 13.6*  PLT 345 304     Recent Labs  09/18/15 0958  NA 132*  K 4.8  BUN 12  CREATININE 0.77  GLUCOSE 128*     Recent Labs  09/17/15 0800  INR 1.20     Assessment/Plan: 1 Day Post-Op Procedure(s) (LRB): INTRAMEDULLARY (IM) NAIL INTERTROCHANTRIC (Left)   Discharge to SNF when medically stable per medicine WBAT LLE when up

## 2015-09-20 NOTE — Evaluation (Signed)
Occupational Therapy Evaluation Patient Details Name: ARIENNA [name] MRN: [id number] DOB: 1935-09-29 Today's Date: 09/20/2015    History of Present Illness 80 yo female resident of SNF with recent fall on 09/16/15 withresulting femoral fracture and IM nailing on 09/18/15.   Clinical Impression   Pt admitted with above. She demonstrates the below listed deficits.  She requires total A for ADLs and Max A +2 for functional transfers.   Recommend SNF level rehab at discharge to reduce burden of care.  All further OT needs can be met at [facility].     Follow Up Recommendations  SNF    Equipment Recommendations  None recommended by OT    Recommendations for Other Services       Precautions / Restrictions Precautions Precautions: Fall Precaution Comments: Dementia, h/o falls  Restrictions Weight Bearing Restrictions: Yes LLE Weight Bearing: Weight bearing as tolerated      Mobility Bed Mobility Overal bed mobility: Needs Assistance;+2 for physical assistance;+ 2 for safety/equipment Bed Mobility: Supine to Sit     Supine to sit: +2 for physical assistance;+2 for safety/equipment;Max assist     General bed mobility comments: swing around for management of discomfort and pt fear of pain  Transfers Overall transfer level: Needs assistance Equipment used: 2 person hand held assist Transfers: Sit to/from Stand;Stand Pivot Transfers Sit to Stand: Max assist;+2 physical assistance;+2 safety/equipment Stand pivot transfers: Max assist;+2 physical assistance;+2 safety/equipment       General transfer comment: Pt pivoted to chair with assist to shift weight and assist for balance     Balance Overall balance assessment:  (pt actively resists sitting but is fair with moments )                                          ADL Overall ADL's : Needs assistance/impaired Eating/Feeding: Maximal assistance;Sitting   Grooming: Wash/dry hands;Wash/dry face;Oral care;Brushing  hair;Total assistance;Sitting;Bed level   Upper Body Bathing: Total assistance;Bed level;Sitting   Lower Body Bathing: Total assistance;Bed level;Sit to/from stand   Upper Body Dressing : Total assistance;Sitting;Bed level   Lower Body Dressing: Total assistance;Sit to/from stand;Bed level   Toilet Transfer: +2 for physical assistance;BSC;Maximal assistance           Functional mobility during ADLs: +2 for physical assistance;Maximal assistance       Vision     Perception     Praxis      Pertinent Vitals/Pain Pain Assessment: Faces Faces Pain Scale: Hurts even more Pain Location: Rt hip Pain Intervention(s): Premedicated before session;Monitored during session     Hand Dominance     Extremity/Trunk Assessment Upper Extremity Assessment Upper Extremity Assessment: Generalized weakness   Lower Extremity Assessment Lower Extremity Assessment: Defer to PT evaluation   Cervical / Trunk Assessment Cervical / Trunk Assessment: Kyphotic   Communication Communication Communication: Other (comment) (Pt with limited verbalizations )   Cognition Arousal/Alertness: Lethargic;Awake/alert Behavior During Therapy: Flat affect (irritable ) Overall Cognitive Status: History of cognitive impairments - at baseline       Memory: Decreased recall of precautions;Decreased short-term memory             General Comments       Exercises       Shoulder Instructions      Home Living Family/patient expects to be discharged to:: Skilled nursing facility  Additional Comments: Pt is Long [facility] at [facility], since 2013      Prior Functioning/Environment Level of Independence: Needs assistance  Gait / Transfers Assistance Needed: Per sister, pt able to ambulate short distances with +1 HHA, but used w/c mostly.  Sister reports pt with scisoring gait and frequent falls  ADL's / Homemaking Assistance Needed: Pt  required assist for bathing and dressing    Comments: confusion with anesthesia related lethargy but previously said no torequests to move    OT Diagnosis: Generalized weakness;Cognitive deficits;Acute pain   OT Problem List: Decreased strength;Impaired balance (sitting and/or standing);Decreased activity tolerance;Decreased cognition;Decreased knowledge of use of DME or AE;Decreased safety awareness;Pain   OT Treatment/Interventions:      OT Goals(Current goals can be found in the care plan section) Acute Rehab OT Goals Patient Stated Goal: none stated OT Goal Formulation: All assessment and education complete, DC therapy  OT Frequency:     Barriers to D/C:            Co-evaluation PT/OT/SLP Co-Evaluation/Treatment: Yes Reason for Co-Treatment: For patient/therapist safety PT goals addressed during session: Mobility/safety with mobility;Balance OT goals addressed during session: ADL's and self-care      End of Session Equipment Utilized During Treatment: Oxygen Nurse Communication: Mobility status  Activity Tolerance: Patient limited by pain Patient left: in chair;with call bell/phone within reach;with family/visitor present   Time: 1211-1232 OT Time Calculation (min): 21 min Charges:  OT General Charges $OT Visit: 1 Procedure OT Evaluation $Initial OT Evaluation Tier I: 1 Procedure G-Codes:    Siriah Treat M 10-01-2015, 1:45 PM

## 2015-09-20 NOTE — Op Note (Signed)
NAMEKOSISOCHUKWU, GOLDBERG NO.:  [id number]  MEDICAL RECORD NO.:  [id number]  LOCATION:  5N25C                        FACILITY:  [facility]  PHYSICIAN:  Samson Frederic, MD     DATE OF BIRTH:  24-Jul-1935  DATE OF PROCEDURE:  09/19/2015 DATE OF DISCHARGE:                              OPERATIVE REPORT   SURGEON:  Samson Frederic, MD.  ASSISTANT:  April Neva Seat, RNFA.  PREOPERATIVE DIAGNOSIS:  Left pertrochanteric femur fracture.  POSTOPERATIVE DIAGNOSIS:  Left pertrochanteric femur fracture.  PROCEDURE PERFORMED:  Closed reduction and cephalomedullary nailing of left pertrochanteric femur fracture.  IMPLANTS: 1. Synthes TFNA nail 11 x 360 mm, 130 degrees. 2. TFNA helical blade 110 mm. 3. A 5 mm distal interlocking screw x1.  ANTIBIOTICS: 1. 600 mg of clindamycin. 2. 1 g of vancomycin (due to residence in nursing facility).  COMPLICATIONS:  None.  SPECIMENS:  None.  DISPOSITION:  Stable to PACU.  INDICATIONS:  The patient is a 80 year old female with severe dementia who resides at a nursing home.  She was found down in her nursing home and brought by EMS to the hospital where x-rays revealed a pertrochanteric femur fracture.  She was admitted to the Hospitalist Service.  She was vomiting a small amount of dark blood, and there was concern for a GI bleed.  She was watched for a couple of days by the Medical Service to ensure that this issue was stable.  She then underwent perioperative risk stratification, medical optimization. Risks, benefits, and alternatives were explained to the patient and her sister, they have elected to proceed.  DESCRIPTION OF PROCEDURE:  I identified the patient using 2 identifiers. I marked the surgical site.  She was taken to the operating room. General anesthesia was induced on her bed.  She was then transferred to the [facility] table.  The right lower extremity was scissored onto the left. I reduced the fracture with standard traction,  internal rotation, and adduction.  Time-out was called verifying side and site of surgery.  The patient did receive IV antibiotics within 60 minutes of beginning the procedure.  Left lower extremity was prepped and draped in normal sterile surgical fashion.  I began by making a 4 cm incision proximal to the trochanter.  I used a starting awl to gain the standard starting point for trochanteric entry nail.  I then placed a long guidewire down to the physeal scar.  I measured the length of the nail.  I used the entry reamer and then I reamed up to 12.5.  I selected the real nail, which was impacted into place.  I removed the guidewire.  Through a separate stab incision, I advanced the cannula for the cephalomedullary device down to the bone.  I then placed a guide pin under live AP and lateral fluoroscopy.  I measured the length of the device and then the real TFN blade was impacted into place.  I compressed the fracture through the jig.  I locked the set screw.  I removed the jig.  AP and lateral fluoroscopy views were used to confirm fracture reduction and hardware placement.  Tip apex distance was appropriate.  I then used  perfect circle technique to place 1 distal interlocking screw.  All wounds were copiously irrigated with saline.  Final AP and lateral fluoroscopy views were obtained.  I closed the wounds with #1 Vicryl for the fascia, 2-0 Monocryl for the deep dermal layer, 3-0 running Monocryl subcuticular stitch.  Glue was applied.  Once the glue was hardened, sterile dressings were applied.  The patient was transferred to her bed, extubated, and taken to PACU in stable condition.  Sponge, needle, and instrument counts were correct at the end of the case x2.  There were no known complications.  I discussed operative events and findings with the patient's family. She may weight bear as tolerated with a walker.  We will put her on aspirin for DVT prophylaxis, which is what  Hospitalist elected as their agent of choice.  We will work on disposition planning.  I will see her in the office 2 weeks after discharge.          ______________________________ Samson Frederic, MD     BS/MEDQ  D:  09/19/2015  T:  09/20/2015  Job:  [id number]

## 2015-09-20 NOTE — Evaluation (Signed)
Physical Therapy Evaluation Patient Details Name: Kristen [name] MRN: [id number] DOB: 30-Dec-1934 Today's Date: 09/20/2015   History of Present Illness  80 yo female resident of SNF with recent fall on 09/16/15 withresulting femoral fracture and IM nailing on 09/18/15.  Clinical Impression  Pt was assessed with family present todiscuss her PLOF and previous care as pt has been a resident in SNF for several years.  Her plan with family is to transition to PT and try to increase her safety and lower body strength for standing.  This should translate hopefully into fewer falls with staff and pt.    Follow Up Recommendations SNF    Equipment Recommendations  None recommended by PT    Recommendations for Other Services       Precautions / Restrictions Precautions Precautions: Fall Precaution Comments: Dementia Restrictions Weight Bearing Restrictions: Yes LLE Weight Bearing: Weight bearing as tolerated      Mobility  Bed Mobility Overal bed mobility: Needs Assistance;+2 for physical assistance;+ 2 for safety/equipment Bed Mobility: Supine to Sit     Supine to sit: +2 for physical assistance;+2 for safety/equipment;Max assist     General bed mobility comments: swing around for management of discomfort and pt fear of pain  Transfers Overall transfer level: Needs assistance Equipment used: 2 person hand held assist Transfers: Sit to/from Stand;Stand Pivot Transfers Sit to Stand: Max assist;+2 physical assistance;+2 safety/equipment Stand pivot transfers: Max assist;+2 physical assistance;+2 safety/equipment       General transfer comment: sidestep to chair with PT and OT  Ambulation/Gait             General Gait Details: unable x transfer  Stairs            Wheelchair Mobility    Modified Rankin (Stroke Patients Only)       Balance Overall balance assessment:  (pt actively resists sitting but is fair with moments )                                           Pertinent Vitals/Pain Pain Assessment: Faces Faces Pain Scale: Hurts even more Pain Location: R hip Pain Intervention(s): Premedicated before session;Repositioned    Home Living Family/patient expects to be discharged to:: Skilled nursing facility                      Prior Function Level of Independence: Needs assistance   Gait / Transfers Assistance Needed: 1-2 assist for short walker gait trips  ADL's / Homemaking Assistance Needed: SNF resident  Comments: confusion with anesthesia related lethargy but previously said no torequests to move     Hand Dominance        Extremity/Trunk Assessment   Upper Extremity Assessment: Generalized weakness           Lower Extremity Assessment: Generalized weakness      Cervical / Trunk Assessment: Kyphotic  Communication   Communication: Receptive difficulties;Other (comment) (Dementia)  Cognition Arousal/Alertness: Awake/alert;Lethargic Behavior During Therapy: Agitated Overall Cognitive Status: History of cognitive impairments - at baseline       Memory: Decreased recall of precautions;Decreased short-term memory              General Comments General comments (skin integrity, edema, etc.): Pt is able to sit with comfort in chair after transitioning with PT and OT, but due to dementia and evaluation was in need  of co-therapy.  Will antiicpate she return to SNF for rehab intervention as her family has agreed    Exercises        Assessment/Plan    PT Assessment Patient needs continued PT services  PT Diagnosis Difficulty walking;Generalized weakness;Altered mental status   PT Problem List Decreased strength;Decreased range of motion;Decreased activity tolerance;Decreased balance;Decreased mobility;Decreased coordination;Decreased cognition;Decreased knowledge of use of DME;Decreased safety awareness;Decreased knowledge of precautions;Decreased skin integrity;Pain  PT Treatment  Interventions DME instruction;Gait training;Functional mobility training;Therapeutic activities;Therapeutic exercise;Cognitive remediation;Neuromuscular re-education;Balance training;Patient/family education   PT Goals (Current goals can be found in the Care Plan section) Acute Rehab PT Goals Patient Stated Goal: none stated PT Goal Formulation: With family Time For Goal Achievement: 10/04/15 Potential to Achieve Goals: Good    Frequency Min 5X/week   Barriers to discharge Other (comment) (SNF is good for discharge)      Co-evaluation PT/OT/SLP Co-Evaluation/Treatment: Yes Reason for Co-Treatment: For patient/therapist safety;Necessary to address cognition/behavior during functional activity PT goals addressed during session: Mobility/safety with mobility;Balance OT goals addressed during session: Strengthening/ROM;ADL's and self-care       End of Session Equipment Utilized During Treatment: Oxygen Activity Tolerance: Patient tolerated treatment well;No increased pain;Patient limited by lethargy Patient left: in chair;with call bell/phone within reach;with family/visitor present Nurse Communication: Mobility status         Time: 1211-1232 PT Time Calculation (min) (ACUTE ONLY): 21 min   Charges:   PT Evaluation $Initial PT Evaluation Tier I: 1 Procedure     PT G CodesIvar Drape September 21, 2015, 1:29 PM   Samul Dada, PT MS Acute Rehab Dept. Number: [facility] [id number] and [facility] [phone]

## 2015-09-20 NOTE — Progress Notes (Signed)
Kristen [name] PRF:[id number] DOB: 05-Jul-1935 DOA: 09/17/2015 PCP: [name], MD  Brief narrative: 80 -year-old female with moderate to severe dementia, major depression, reflux, vitamin D deficiency, rheumatoid arthritis, history of recurrent UTIs admitted from [facility] health and rehabilitation with history of fall and found to have a comminuted displaced intertrochanteric left femoral fracture She cannot tell me any of the history and by report apparently patient had vomiting of dark emesis which was Gastroccult positive  Nursing reports some dark urine as well which looked bloody She ultimately underwent operative fixation of the hip on 9/30   Past medical history-As per Problem list Chart reviewed as below-   Consultants:  Ortho  Procedures:  none  Antibiotics:  none  Fair up in chair Confused but pleasant no opther issues No sig pain   Objective    Interim History:   Telemetry:    Objective: Filed Vitals:   09/19/15 1743 09/19/15 2047 09/20/15 0611 09/20/15 1448  BP: 111/63 105/63 95/56 90/52   Pulse: 102 110 112 109  Temp: 98.3 F (36.8 C) 98.4 F (36.9 C) 98.2 F (36.8 C) 98.2 F (36.8 C)  TempSrc:  Axillary Axillary Axillary  Resp: 20 18 17 20   SpO2: 94% 96% 92% 98%    Intake/Output Summary (Last 24 hours) at 09/20/15 1742 Last data filed at 09/20/15 0900  Gross per 24 hour  Intake    790 ml  Output    400 ml  Net    390 ml    Exam:  General: eomi, frail, bitemporalis wasting Cardiovascular:  s1 s2 no m/r/g Respiratory: clear no added sound Abdomen:  Soft nt nd no rebound Skin no le edema  Data Reviewed: Basic Metabolic Panel:  Recent Labs Lab 09/17/15 0240 09/18/15 0958 09/20/15 0758  NA 131* 132* 129*  K 4.5 4.8 4.0  CL 98* 98* 93*  CO2 24 27 25   GLUCOSE 193* 128* 109*  BUN 17 12 8   CREATININE 0.79 0.77 0.82  CALCIUM 8.6* 8.6* 7.9*   Liver Function Tests: No results for input(s): AST, ALT, ALKPHOS, BILITOT,  PROT, ALBUMIN in the last 168 hours. No results for input(s): LIPASE, AMYLASE in the last 168 hours. No results for input(s): AMMONIA in the last 168 hours. CBC:  Recent Labs Lab 09/17/15 0240 09/17/15 0446 09/18/15 0339 09/19/15 0935 09/20/15 0758  WBC 14.6*  --  12.1* 13.6* 9.5  NEUTROABS 13.9*  --   --  12.2*  --   HGB 11.4* 11.4* 10.3* 9.3* 8.4*  HCT 35.0* 35.1* 32.0* 28.4* 25.5*  MCV 88.6  --  88.9 87.9 87.9  PLT 323  --  345 304 269   Cardiac Enzymes: No results for input(s): CKTOTAL, CKMB, CKMBINDEX, TROPONINI in the last 168 hours. BNP: Invalid input(s): POCBNP CBG: No results for input(s): GLUCAP in the last 168 hours.  Recent Results (from the past 240 hour(s))  Surgical pcr screen     Status: None   Collection Time: 09/19/15 11:33 AM  Result Value Ref Range Status   MRSA, PCR NEGATIVE NEGATIVE Final   Staphylococcus aureus NEGATIVE NEGATIVE Final    Comment:        The Xpert SA Assay (FDA approved for NASAL specimens in patients over 89 years of age), is one component of a comprehensive surveillance program.  Test performance has been validated by [facility] for patients greater than or equal to 68 year old. It is not intended to diagnose infection nor to guide or monitor  treatment.      Studies:              All Imaging reviewed and is as per above notation   Scheduled Meds: . aspirin EC  325 mg Oral BID PC  . calcitonin (salmon)  1 spray Alternating Nares Daily  . calcium-vitamin D  1 tablet Oral BID  . DULoxetine  20 mg Oral Daily  . famotidine  20 mg Oral Daily  . mirtazapine  15 mg Oral QHS  . pantoprazole  40 mg Oral Daily  . senna-docusate  2 tablet Oral Daily   Continuous Infusions: . sodium chloride 50 mL/hr at 09/20/15 1534     Assessment/Plan:  Hip fracture EKG benign, chest x-ray benign No overt bleeding  would delay aspirin by 1 day and ensure that she is eating normally prior to administering the same  GI bleed, with  likely gastritis Likely minimal bleed, hemoglobin stable 10-11 range No evidence hematemesis with soft diet and graduated this today to normal diet 9/29 Continue EPI post surgery Hemoglobin has expected drop with ABLE from surgeyr No further work-up Rpt cbc am  Elbow pain No # on x-ray two-view of the left elbow done 9/29  HTN with some hypotension discontinue lisinopril 5 mg daily  Osteoporosis Continue Os-Cal 500 twice a day, calcitonin presumably for pathologic fractures in the past  Rheumatoid arthritis Continue methotrexate 12.5 every Monday  Appetite, depression Continue Remeron 15 mg daily at bedtime Continue Cymbalta 20 daily    Appt with PCP: Requested Code Status: Full code Family Communicatiodiscussed in person with sister at the bedside  Disposition Plan: snf DVT prophylaxis: SCD Consultants: Corky Mull, MD  [facility] Pager [phone] 09/20/2015, 5:42 PM    LOS: 3 days

## 2015-09-21 ENCOUNTER — Encounter (HOSPITAL_COMMUNITY): Payer: Self-pay | Admitting: Orthopedic Surgery

## 2015-09-21 LAB — COMPREHENSIVE METABOLIC PANEL
ALBUMIN: 2.1 g/dL — AB (ref 3.5–5.0)
ALK PHOS: 52 U/L (ref 38–126)
ALT: 13 U/L — ABNORMAL LOW (ref 14–54)
ANION GAP: 6 (ref 5–15)
AST: 26 U/L (ref 15–41)
BILIRUBIN TOTAL: 0.8 mg/dL (ref 0.3–1.2)
BUN: 9 mg/dL (ref 6–20)
CALCIUM: 7.6 mg/dL — AB (ref 8.9–10.3)
CO2: 28 mmol/L (ref 22–32)
Chloride: 94 mmol/L — ABNORMAL LOW (ref 101–111)
Creatinine, Ser: 0.82 mg/dL (ref 0.44–1.00)
GLUCOSE: 93 mg/dL (ref 65–99)
Potassium: 3.4 mmol/L — ABNORMAL LOW (ref 3.5–5.1)
Sodium: 128 mmol/L — ABNORMAL LOW (ref 135–145)
TOTAL PROTEIN: 5.9 g/dL — AB (ref 6.5–8.1)

## 2015-09-21 LAB — CBC WITH DIFFERENTIAL/PLATELET
BASOS ABS: 0 10*3/uL (ref 0.0–0.1)
BASOS PCT: 0 %
EOS PCT: 6 %
Eosinophils Absolute: 0.4 10*3/uL (ref 0.0–0.7)
HEMATOCRIT: 21.6 % — AB (ref 36.0–46.0)
Hemoglobin: 7.1 g/dL — ABNORMAL LOW (ref 12.0–15.0)
Lymphocytes Relative: 14 %
Lymphs Abs: 0.9 10*3/uL (ref 0.7–4.0)
MCH: 29.5 pg (ref 26.0–34.0)
MCHC: 32.9 g/dL (ref 30.0–36.0)
MCV: 89.6 fL (ref 78.0–100.0)
MONO ABS: 0.5 10*3/uL (ref 0.1–1.0)
Monocytes Relative: 8 %
NEUTROS ABS: 4.7 10*3/uL (ref 1.7–7.7)
Neutrophils Relative %: 72 %
PLATELETS: 220 10*3/uL (ref 150–400)
RBC: 2.41 MIL/uL — ABNORMAL LOW (ref 3.87–5.11)
RDW: 16.8 % — AB (ref 11.5–15.5)
WBC: 6.6 10*3/uL (ref 4.0–10.5)

## 2015-09-21 LAB — PREPARE RBC (CROSSMATCH)

## 2015-09-21 MED ORDER — SODIUM CHLORIDE 0.9 % IV SOLN: Freq: Once | INTRAVENOUS | Status: DC

## 2015-09-21 MED ORDER — SODIUM CHLORIDE 0.9 % IV SOLN
Freq: Once | INTRAVENOUS | Status: AC
  Administered 2015-09-21: 17:00:00 via INTRAVENOUS

## 2015-09-21 NOTE — Progress Notes (Signed)
Physical Therapy Treatment Patient Details Name: Kristen [name] MRN: [id number] DOB: 1935-08-10 Today's Date: 09/21/2015    History of Present Illness 80 yo female resident of SNF with recent fall on 09/16/15 withresulting femoral fracture and IM nailing on 09/18/15.    PT Comments    Patient requiring +2 assistance with transfers and bed mobility at this time. Will continue to follow to progress mobility and independence as patient tolerates. Anticipate SNF upon D/C.   Follow Up Recommendations  SNF     Equipment Recommendations  None recommended by PT    Recommendations for Other Services       Precautions / Restrictions Precautions Precautions: Fall Precaution Comments: Dementia, h/o falls  Restrictions Weight Bearing Restrictions: Yes LLE Weight Bearing: Weight bearing as tolerated    Mobility  Bed Mobility Overal bed mobility: Needs Assistance;+2 for physical assistance Bed Mobility: Supine to Sit     Supine to sit: Max assist;+2 for physical assistance     General bed mobility comments: Trunk and LE assistance required for mobility  Transfers Overall transfer level: Needs assistance Equipment used: 2 person hand held assist Transfers: Stand Pivot Transfers Sit to Stand: Max assist;+2 physical assistance Stand pivot transfers: +2 physical assistance;Max assist       General transfer comment: Pivot to chair with assist   Ambulation/Gait                 Stairs            Wheelchair Mobility    Modified Rankin (Stroke Patients Only)       Balance Overall balance assessment: Needs assistance Sitting-balance support: Bilateral upper extremity supported Sitting balance-Leahy Scale: Poor     Standing balance support: Bilateral upper extremity supported Standing balance-Leahy Scale: Poor                      Cognition Arousal/Alertness: Awake/alert Behavior During Therapy: Flat affect Overall Cognitive Status: History of  cognitive impairments - at baseline                      Exercises      General Comments        Pertinent Vitals/Pain Pain Assessment: Faces Faces Pain Scale: Hurts little more Pain Location: Rt hip Pain Intervention(s): Monitored during session;Limited activity within patient's tolerance    Home Living                      Prior Function            PT Goals (current goals can now be found in the care plan section) Acute Rehab PT Goals Patient Stated Goal: none stated PT Goal Formulation: With family Time For Goal Achievement: 10/04/15 Potential to Achieve Goals: Good Progress towards PT goals: Progressing toward goals    Frequency  Min 3X/week    PT Plan Frequency needs to be updated    Co-evaluation             End of Session Equipment Utilized During Treatment: Gait belt;Oxygen Activity Tolerance: Patient tolerated treatment well Patient left: in chair;with call bell/phone within reach;with chair alarm set     Time: 5188-4166 PT Time Calculation (min) (ACUTE ONLY): 16 min  Charges:  $Therapeutic Activity: 8-22 mins                    G Codes:      Christiane Ha, PT, CSCS Pager [phone]  [phone] Office [phone]  09/21/2015, 1:46 PM

## 2015-09-21 NOTE — Progress Notes (Signed)
Kristen [name]  MRN: [id number] DOB/Age: Jul 17, 1935 80 y.o. Physician: Jacquelyne Balint Procedure: Procedure(s) (LRB): INTRAMEDULLARY (IM) NAIL INTERTROCHANTRIC (Left)     Subjective: Up in chair. No specific complaints however unable to provide history or even remember which hip was broken.  Vital Signs Temp:  [97.5 F (36.4 C)-98.2 F (36.8 C)] 97.5 F (36.4 C) (10/02 0530) Pulse Rate:  [77-109] 77 (10/02 0530) Resp:  [17-20] 17 (10/02 0530) BP: (90-98)/(49-56) 98/56 mmHg (10/02 0530) SpO2:  [94 %-100 %] 100 % (10/02 0530)  Lab Results  Recent Labs  09/20/15 0758 09/21/15 0610  WBC 9.5 6.6  HGB 8.4* 7.1*  HCT 25.5* 21.6*  PLT 269 220   BMET  Recent Labs  09/20/15 0758 09/21/15 0610  NA 129* 128*  K 4.0 3.4*  CL 93* 94*  CO2 25 28  GLUCOSE 109* 93  BUN 8 9  CREATININE 0.82 0.82  CALCIUM 7.9* 7.6*   INR  Date Value Ref Range Status  09/17/2015 1.20 0.00 - 1.49 Final     Exam Her sats dropped a couple times on room air while in room, when instructed to deep breath, they normalized Her left thigh is soft with no evidence of active bleeding, dressings dry Moves feet on command        Plan Acute Blood Loss Anemia- with hip surgery and reports of mild GI bleed with hgb trending down to 7.1 this am fell she will do better with 2 u PRBCs today. Will need to closely monitor for signs of GI bleeding, as patient unable to give me any additional history.  Continue with other OOB mobilization orders.  Medical care per hospitalists  [facility] for Dr.Kevin Supple 09/21/2015, 10:14 AM Contact # [phone]

## 2015-09-21 NOTE — Progress Notes (Signed)
Kristen [name]:[id number] DOB: 03/26/1935 DOA: 09/17/2015 PCP: [name], MD  Brief narrative: 80 -year-old female with moderate to severe dementia, major depression, reflux, vitamin D deficiency, rheumatoid arthritis, history of recurrent UTIs admitted from [facility] health and rehabilitation with history of fall and found to have a comminuted displaced intertrochanteric left femoral fracture She cannot tell me any of the history and by report apparently patient had vomiting of dark emesis which was Gastroccult positive  Nursing reports some dark urine as well which looked bloody She ultimately underwent operative fixation of the hip on 9/30   Past medical history-As per Problem list Chart reviewed as below-   Consultants:  Ortho  Procedures:  none  Antibiotics:  none   Confused Nursing doesn't report any bleeding toleratin gdiet well No othe rc/o Confused as usual   Objective    Interim History:   Telemetry:    Objective: Filed Vitals:   09/20/15 0611 09/20/15 1448 09/20/15 1931 09/21/15 0530  BP: 95/56 90/52 92/49  98/56  Pulse: 112 109 99 77  Temp: 98.2 F (36.8 C) 98.2 F (36.8 C) 97.5 F (36.4 C) 97.5 F (36.4 C)  TempSrc: Axillary Axillary Axillary Axillary  Resp: 17 20 17 17   SpO2: 92% 98% 94% 100%   No intake or output data in the 24 hours ending 09/21/15 1038  Exam:  General: eomi, frail, bitemporalis wasting Cardiovascular:  s1 s2 no m/r/g Respiratory: clear no added sound Abdomen:  Soft nt nd no rebound Skin no le edema  Data Reviewed: Basic Metabolic Panel:  Recent Labs Lab 09/17/15 0240 09/18/15 0958 09/20/15 0758 09/21/15 0610  NA 131* 132* 129* 128*  K 4.5 4.8 4.0 3.4*  CL 98* 98* 93* 94*  CO2 24 27 25 28   GLUCOSE 193* 128* 109* 93  BUN 17 12 8 9   CREATININE 0.79 0.77 0.82 0.82  CALCIUM 8.6* 8.6* 7.9* 7.6*   Liver Function Tests:  Recent Labs Lab 09/21/15 0610  AST 26  ALT 13*  ALKPHOS 52  BILITOT  0.8  PROT 5.9*  ALBUMIN 2.1*   No results for input(s): LIPASE, AMYLASE in the last 168 hours. No results for input(s): AMMONIA in the last 168 hours. CBC:  Recent Labs Lab 09/17/15 0240 09/17/15 0446 09/18/15 0339 09/19/15 0935 09/20/15 0758 09/21/15 0610  WBC 14.6*  --  12.1* 13.6* 9.5 6.6  NEUTROABS 13.9*  --   --  12.2*  --  4.7  HGB 11.4* 11.4* 10.3* 9.3* 8.4* 7.1*  HCT 35.0* 35.1* 32.0* 28.4* 25.5* 21.6*  MCV 88.6  --  88.9 87.9 87.9 89.6  PLT 323  --  345 304 269 220   Cardiac Enzymes: No results for input(s): CKTOTAL, CKMB, CKMBINDEX, TROPONINI in the last 168 hours. BNP: Invalid input(s): POCBNP CBG: No results for input(s): GLUCAP in the last 168 hours.  Recent Results (from the past 240 hour(s))  Surgical pcr screen     Status: None   Collection Time: 09/19/15 11:33 AM  Result Value Ref Range Status   MRSA, PCR NEGATIVE NEGATIVE Final   Staphylococcus aureus NEGATIVE NEGATIVE Final    Comment:        The Xpert SA Assay (FDA approved for NASAL specimens in patients over 110 years of age), is one component of a comprehensive surveillance program.  Test performance has been validated by [facility] for patients greater than or equal to 1 year old. It is not intended to diagnose infection nor to guide or  monitor treatment.      Studies:              All Imaging reviewed and is as per above notation   Scheduled Meds: . sodium chloride   Intravenous Once  . sodium chloride   Intravenous Once  . aspirin EC  325 mg Oral BID PC  . calcitonin (salmon)  1 spray Alternating Nares Daily  . calcium-vitamin D  1 tablet Oral BID  . DULoxetine  20 mg Oral Daily  . famotidine  20 mg Oral Daily  . mirtazapine  15 mg Oral QHS  . pantoprazole  40 mg Oral Daily  . senna-docusate  2 tablet Oral Daily   Continuous Infusions: . sodium chloride 50 mL/hr at 09/21/15 0940     Assessment/Plan:  Hip fracture EKG benign, chest x-ray benign No overt bleeding   would delay aspirin by 1 day and ensure that she is eating normally prior to administering the same  ABLA from surgery with superimposed 1 time episode dark vomit-doesn't have acute GI bleed continue diet 9/29 transfuse 2 u prbc Rpt cbc am  Elbow pain No # on x-ray two-view of the left elbow done 9/29  HTN with some hypotension discontinue lisinopril 5 mg daily  Osteoporosis Continue Os-Cal 500 twice a day, calcitonin presumably for pathologic fractures in the past  Rheumatoid arthritis Continue methotrexate 12.5 every Monday  Appetite, depression Continue Remeron 15 mg daily at bedtime Continue Cymbalta 20 daily    Appt with PCP: Requested Code Status: Full code   Pleas Koch, MD  [facility] Pager [phone] 09/21/2015, 10:38 AM    LOS: 4 days

## 2015-09-21 NOTE — Care Management Note (Signed)
Case Management Note  Patient Details  Name: Kristen [name] MRN: [id number] Date of Birth: 12-Aug-1935   Subjective/Objective:                  Resident of SNF [facility]) with recent fall on 09/16/15 with resulting Left femoral fracture and IM nailing on 09/18/15.   Action/Plan: Pt was a previous resident of [facility]. SW following for discharge planning needs for likely discharge back to SNF. CM remains available for any further discharge planning needs.    Expected Discharge Date:                  Expected Discharge Plan:  Skilled Nursing Facility  In-House Referral:  Clinical Social Work  Discharge planning Services  CM Consult  Post Acute Care Choice:    Choice offered to:     DME Arranged:    DME Agency:     HH Arranged:    HH Agency:     Status of Service:  In process, will continue to follow  Medicare Important Message Given:    Date Medicare IM Given:    Medicare IM give by:    Date Additional Medicare IM Given:    Additional Medicare Important Message give by:     If discussed at Long Length of Stay Meetings, dates discussed:    Additional Comments:  Darcel Smalling, RN 09/21/2015, 4:40 PM

## 2015-09-21 NOTE — Progress Notes (Signed)
Her sister, Raelyn Number is an RN], had requested I call her cell phone # to let her know if she should have a transfusion reaction. Called [facility] Pikes to state the patient was doing well, improved O2 sats w/o O2, color improved, lips pinker, joking with the staff and seems more alert. Tech was able to feed her majority of her supper w/o resistance. Currently is napping.

## 2015-09-22 DIAGNOSIS — S72102D Unspecified trochanteric fracture of left femur, subsequent encounter for closed fracture with routine healing: Secondary | ICD-10-CM

## 2015-09-22 DIAGNOSIS — S72001B Fracture of unspecified part of neck of right femur, initial encounter for open fracture type I or II: Secondary | ICD-10-CM

## 2015-09-22 DIAGNOSIS — D638 Anemia in other chronic diseases classified elsewhere: Secondary | ICD-10-CM

## 2015-09-22 LAB — CBC WITH DIFFERENTIAL/PLATELET
BASOS PCT: 0 %
Basophils Absolute: 0 10*3/uL (ref 0.0–0.1)
EOS ABS: 0.5 10*3/uL (ref 0.0–0.7)
EOS PCT: 6 %
HCT: 32.6 % — ABNORMAL LOW (ref 36.0–46.0)
HEMOGLOBIN: 10.8 g/dL — AB (ref 12.0–15.0)
Lymphocytes Relative: 8 %
Lymphs Abs: 0.6 10*3/uL — ABNORMAL LOW (ref 0.7–4.0)
MCH: 29.6 pg (ref 26.0–34.0)
MCHC: 33.1 g/dL (ref 30.0–36.0)
MCV: 89.3 fL (ref 78.0–100.0)
MONOS PCT: 8 %
Monocytes Absolute: 0.6 10*3/uL (ref 0.1–1.0)
NEUTROS PCT: 78 %
Neutro Abs: 5.9 10*3/uL (ref 1.7–7.7)
Platelets: 272 10*3/uL (ref 150–400)
RBC: 3.65 MIL/uL — ABNORMAL LOW (ref 3.87–5.11)
RDW: 16.4 % — ABNORMAL HIGH (ref 11.5–15.5)
WBC: 7.5 10*3/uL (ref 4.0–10.5)

## 2015-09-22 LAB — BASIC METABOLIC PANEL
Anion gap: 6 (ref 5–15)
BUN: 10 mg/dL (ref 6–20)
CALCIUM: 8.3 mg/dL — AB (ref 8.9–10.3)
CO2: 30 mmol/L (ref 22–32)
CREATININE: 0.72 mg/dL (ref 0.44–1.00)
Chloride: 101 mmol/L (ref 101–111)
GFR calc non Af Amer: >60 mL/min (ref >60)
Glucose, Bld: 94 mg/dL (ref 65–99)
Potassium: 3.7 mmol/L (ref 3.5–5.1)
SODIUM: 137 mmol/L (ref 135–145)

## 2015-09-22 LAB — CBC
HCT: 32.4 % — ABNORMAL LOW (ref 36.0–46.0)
Hemoglobin: 10.5 g/dL — ABNORMAL LOW (ref 12.0–15.0)
MCH: 28.8 pg (ref 26.0–34.0)
MCHC: 32.4 g/dL (ref 30.0–36.0)
MCV: 89 fL (ref 78.0–100.0)
Platelets: 268 10*3/uL (ref 150–400)
RBC: 3.64 MIL/uL — ABNORMAL LOW (ref 3.87–5.11)
RDW: 16.1 % — AB (ref 11.5–15.5)
WBC: 7.2 10*3/uL (ref 4.0–10.5)

## 2015-09-22 MED ORDER — PANTOPRAZOLE SODIUM 40 MG PO TBEC: 40.0000 mg | DELAYED_RELEASE_TABLET | Freq: Every day | ORAL | Status: DC

## 2015-09-22 MED ORDER — HYDROCODONE-ACETAMINOPHEN 5-325 MG PO TABS: 1.0000 | ORAL_TABLET | Freq: Four times a day (QID) | ORAL | Status: DC | PRN

## 2015-09-22 NOTE — Progress Notes (Signed)
Physical Therapy Treatment Patient Details Name: Kristen [name] MRN: [id number] DOB: Jun 07, 1935 Today's Date: 09/22/2015    History of Present Illness 80 yo female resident of SNF with recent fall on 09/16/15 withresulting femoral fracture and IM nailing on 09/18/15.    PT Comments    Pt is progressing toward her goal of return to SNF for further rehab and to recover transfer skills as was her PLOF.  Her family is not in attendance today but have expressed support for her POC.  Will continue tomorrow if her dc is held for some reason.  Follow Up Recommendations  SNF     Equipment Recommendations  None recommended by PT    Recommendations for Other Services       Precautions / Restrictions Precautions Precautions: Fall Precaution Comments: Dementia, h/o falls  Restrictions Weight Bearing Restrictions: Yes LLE Weight Bearing: Weight bearing as tolerated    Mobility  Bed Mobility Overal bed mobility: Needs Assistance Bed Mobility: Supine to Sit Rolling: Mod assist   Supine to sit: Mod assist     General bed mobility comments: mainly assisting trunk until pt began to work more  Transfers Overall transfer level: Needs assistance Equipment used: 1 person hand held assist Transfers: Sit to/from [employer] Sit to Stand: Mod assist Stand pivot transfers: Mod assist       General transfer comment: Pivot to chair with assist   Ambulation/Gait             General Gait Details: steps are sliding mainly to assist to chair   Stairs            Wheelchair Mobility    Modified Rankin (Stroke Patients Only)       Balance Overall balance assessment: Needs assistance Sitting-balance support: Feet supported;Bilateral upper extremity supported Sitting balance-Leahy Scale: Fair Sitting balance - Comments: needed to get set then fair Postural control: Posterior lean Standing balance support: Bilateral upper extremity supported Standing  balance-Leahy Scale: Poor                      Cognition Arousal/Alertness: Awake/alert;Lethargic Behavior During Therapy: Flat affect Overall Cognitive Status: History of cognitive impairments - at baseline       Memory: Decreased recall of precautions;Decreased short-term memory              Exercises      General Comments General comments (skin integrity, edema, etc.): Pt still has her IV in RUE in chair, has low O2 sats when PT entered and pt was asleep.  74% recovered to 96% before transfer then once in chair was 99%.  MD made aware.      Pertinent Vitals/Pain Pain Assessment: Faces Faces Pain Scale: Hurts little more Pain Location: R hip Pain Intervention(s): Limited activity within patient's tolerance;Monitored during session;Premedicated before session;Repositioned;Other (comment) (able to sit in chair with feet on floor)    Home Living                      Prior Function            PT Goals (current goals can now be found in the care plan section) Acute Rehab PT Goals Patient Stated Goal: transfer to chair Progress towards PT goals: Progressing toward goals    Frequency  Min 3X/week    PT Plan Current plan remains appropriate    Co-evaluation             End of  Session   Activity Tolerance: Patient tolerated treatment well;Patient limited by fatigue Patient left: in chair;with call bell/phone within reach;with chair alarm set;with nursing/sitter in room     Time: 0936-1000 PT Time Calculation (min) (ACUTE ONLY): 24 min  Charges:  $Therapeutic Activity: 8-22 mins                    G Codes:      Ivar Drape 10/07/15, 10:53 AM  Samul Dada, PT MS Acute Rehab Dept. Number: [facility] [id number] and [facility] [phone]

## 2015-09-22 NOTE — Discharge Summary (Signed)
Physician Discharge Summary  Kristen [name]:[id number] DOB: 1934-12-27 DOA: 09/17/2015  PCP: [name], MD  Admit date: 09/17/2015 Discharge date: 09/22/2015  Time spent: 33 minutes  Recommendations for Outpatient Follow-up:  1. Needs ASA 325 bid for 1 mo for 2/2 dvt prohylaxis 2. Post hip precautions 3.  check cbc and bmet 1 week 4. 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  History of present illness:  28 -year-old female with moderate to severe dementia, major depression, reflux, vitamin D deficiency, rheumatoid arthritis, history of recurrent UTIs admitted from [facility] with history of fall and found to have a comminuted displaced intertrochanteric left femoral fracture She cannot tell me any of the history and by report apparently patient had vomiting of dark emesis which was Gastroccult positive Nursing reports some dark urine as well which looked bloody She ultimately underwent operative fixation of the hip on 9/30  Hospital Course:  Hip fracture EKG benign, chest x-ray benign No overt bleeding would delay aspirin by 1 day and ensure that she is eating normally prior to administering the same  ABLA from surgery with superimposed 1 time episode dark vomit-doesn't have acute GI bleed continue diet 9/29 transfuse 2 u prbc Rpt cbc on d/c showed a hemoglobin of qo  Gastritis conitnue protonix started in the hospital for at least 8 qweek as is on ASA  Elbow pain No # on x-ray  two-view of the left elbow done 9/29  HTN with some hypotension discontinued lisinopril 5 mg daily this admission  Osteoporosis Continue Os-Cal 500 twice a day, calcitonin presumably for pathologic fractures in the past  Rheumatoid arthritis Continue methotrexate 12.5 every Monday  Appetite, depression Continue Remeron 15 mg daily at bedtime Continue Cymbalta 20 daily  Possible OSA desats to 70's while asleep Lungs clear Could be atelectasis recommend deep brathing and maybe 2 liters oxygen short term  Consultations:  orthopedics  Discharge Exam: Filed Vitals:   09/22/15 0625  BP: 113/58  Pulse: 80  Temp:   Resp: 16    General:  eomi ncat  Cardiovascular:  s1 s2 no m/r/g Respiratory: clear  Discharge Instructions   Discharge Instructions    Diet - low sodium heart healthy    Complete by:  As directed      Increase activity slowly    Complete by:  As directed           Current Discharge Medication List    START taking these medications   Details  HYDROcodone-acetaminophen (NORCO/VICODIN) 5-325 MG tablet Take 1-2 tablets by mouth every 6 (six) hours as needed for moderate pain. Qty: 30 tablet, Refills: 0    pantoprazole (PROTONIX) 40 MG tablet Take 1 tablet (40 mg total) by mouth daily. Qty: 30 tablet, Refills: 0      CONTINUE these medications which have NOT CHANGED   Details  acetaminophen (TYLENOL) 325 MG tablet Take 650 mg by mouth every 4 (four) hours as needed (For fever greater than 99.5).    calcitonin, salmon, (  MIACALCIN/FORTICAL) 200 UNIT/ACT nasal spray Place 1 spray into alternate nostrils daily.    calcium-vitamin D (OSCAL WITH D) 500-200 MG-UNIT per tablet Take 1 tablet by mouth 2 (two) times daily.     cholecalciferol (VITAMIN D) 1000 UNITS tablet Take 1,000 Units by mouth daily.    Cranberry 200 MG CAPS Take 400 mg by mouth 2 (two) times daily.    DULoxetine (CYMBALTA) 20 MG capsule Take 20 mg by mouth daily.    ferrous sulfate  220 (44 FE) MG/5ML solution Take 325 mg by mouth 2 (two) times daily with a meal.    folic acid (FOLVITE) 1 MG tablet Take 1 mg by mouth 2 (two) times daily. 0800 & 1700    lisinopril (PRINIVIL,ZESTRIL) 5 MG tablet Take 5 mg by mouth daily.    Menthol, Topical Analgesic, (BIOFREEZE) 4 % GEL Apply topically every 6 (six) hours.    methotrexate (RHEUMATREX) 2.5 MG tablet Take 12.5 mg by mouth once a week. mondays    mirtazapine (REMERON) 15 MG tablet Take 15 mg by mouth at bedtime.    polyethylene glycol (MIRALAX / GLYCOLAX) packet Take 17 g by mouth every other day.    ranitidine (ZANTAC) 150 MG tablet Take 150 mg by mouth 2 (two) times daily.    sennosides-docusate sodium (SENOKOT-S) 8.6-50 MG tablet Take 2 tablets by mouth daily.    lidocaine (LIDODERM) 5 % Place 1 patch onto the skin daily. Remove & Discard patch within 12 hours to her lower back Qty: 30 patch, Refills: 0   Associated Diagnoses: Back pain      STOP taking these medications     traMADol (ULTRAM) 50 MG tablet      UNABLE TO FIND        Allergies  Allergen Reactions  . Erythromycin Base     Per MAR  . Penicillins     Per MAR   Follow-up Information    Follow up with Swinteck, Cloyde Reams, MD. Schedule an appointment as soon as possible for a visit in 2 weeks.   Specialty:  Orthopedic Surgery   Why:  For wound re-check   Contact information:   [address] [place] [address] [phone]        The results of significant diagnostics from this hospitalization (including imaging, microbiology, ancillary and laboratory) are listed below for reference.    Significant Diagnostic Studies: Dg Chest 1 View  09/17/2015   CLINICAL DATA:  Unwitnessed fall.  EXAM: CHEST 1 VIEW  COMPARISON:  03/20/2014  FINDINGS: Lower lung volumes from prior exam. Cardiomediastinal contours are unchanged allowing for differences in technique and patient rotation. There is linear subsegmental atelectasis at  the right lung base. No consolidation to suggest pneumonia. No pleural effusion or pneumothorax. Probable compression deformities of lower thoracic spine.  IMPRESSION: Hypoventilatory chest with subsegmental atelectasis at the right lung base.   Electronically Signed   By: Rubye Oaks M.D.   On: 09/17/2015 04:11   Dg Elbow Complete Left  09/17/2015   CLINICAL DATA:  Fall, posterior elbow pain  EXAM: LEFT ELBOW - COMPLETE 3+ VIEW  COMPARISON:  None.  FINDINGS: There is no evidence of fracture, dislocation, or joint effusion. There is no evidence of arthropathy or other focal bone abnormality. Soft tissues are unremarkable.Mild arthritic change.  IMPRESSION: No acute findings.   Electronically Signed   By: Esperanza Heir M.D.   On: 09/17/2015 10:57   Ct Head Wo Contrast  09/17/2015  CLINICAL DATA:  Unwitnessed fall just prior to arrival. Fall out of bed this evening.  EXAM: CT HEAD WITHOUT CONTRAST  TECHNIQUE: Contiguous axial images were obtained from the base of the skull through the vertex without intravenous contrast.  COMPARISON:  08/11/2013  FINDINGS: Diffuse cerebellar and cerebral atrophy. Advanced chronic small vessel ischemic change.No intracranial hemorrhage, mass effect, or midline shift. No hydrocephalus. The basilar cisterns are patent. No evidence of territorial infarct. No intracranial fluid collection. Calvarium is intact. Chronic sphenoid sinus disease, with near complete opacification of the left-sided sphenoid sinus and central calcification. Remaining paranasal sinuses are well-aerated. The mastoid air cells are well aerated.  IMPRESSION: 1.  No acute intracranial abnormality. 2. Atrophy and advanced chronic small vessel ischemia.   Electronically Signed   By: Rubye Oaks M.D.   On: 09/17/2015 03:10   Pelvis Portable  09/19/2015   CLINICAL DATA:  80 year old female -postop ORIF left femur fracture.  EXAM: PORTABLE PELVIS 1-2 VIEWS  COMPARISON:  None.  FINDINGS: There has been  interval placement of image medullary nail within the left femur, traversing an intertrochanteric left femur fracture. No complicating features are identified.  IMPRESSION: Internal fixation of intertrochanteric left femur fracture.   Electronically Signed   By: Harmon Pier M.D.   On: 09/19/2015 17:22   Dg C-arm 1-60 Min  09/19/2015   CLINICAL DATA:  Status post IM nail left femur.  EXAM: LEFT FEMUR 2 VIEWS; DG C-ARM 61-120 MIN  COMPARISON:  None.  FINDINGS: Four images from portable C-arm radiography obtained in the operating room show open reduction and internal fixation of the proximal femur fracture. There is been placement of an intra medullary rod and screw device. The hardware components and fracture fragments are in anatomic alignment.  IMPRESSION: 1. Status post ORIF of proximal femur fracture.   Electronically Signed   By: Signa Kell M.D.   On: 09/19/2015 16:01   Dg Hip Unilat With Pelvis 2-3 Views Left  09/17/2015   CLINICAL DATA:  80 year old female post unwitnessed fall, now with left hip pain.  EXAM: DG HIP (WITH OR WITHOUT PELVIS) 2-3V LEFT  COMPARISON:  None.  FINDINGS: There is a comminuted displaced fracture of the intertrochanteric left hip involving the lesser and greater trochanters. Mild proximal migration of the femoral shaft. The femoral head remains seated in the acetabulum. No evidence of additional fracture. The bones are under mineralized.  IMPRESSION: Comminuted displaced intertrochanteric left hip fracture.   Electronically Signed   By: Rubye Oaks M.D.   On: 09/17/2015 03:07   Dg Femur Min 2 Views Left  09/19/2015   CLINICAL DATA:  Status post IM nail left femur.  EXAM: LEFT FEMUR 2 VIEWS; DG C-ARM 61-120 MIN  COMPARISON:  None.  FINDINGS: Four images from portable C-arm radiography obtained in the operating room show open reduction and internal fixation of the proximal femur fracture. There is been placement of an intra medullary rod and screw device. The hardware  components and fracture fragments are in anatomic alignment.  IMPRESSION: 1. Status post ORIF of proximal femur fracture.   Electronically Signed   By: Signa Kell M.D.   On: 09/19/2015 16:01   Dg Femur Min 2 Views Left  09/17/2015   CLINICAL DATA:  Evaluate hip fracture  EXAM: LEFT FEMUR 2 VIEWS  COMPARISON:  Pelvis radiography from earlier today  FINDINGS: Limited study due to patient condition.  There is an acute intertrochanteric left femur fracture with varus angulation and proximal migration  of the distal fragment. Fracture is comminuted with isolated lesser trochanter fragment. Femoral head is located. No additional fracture is seen. Osteopenia without evidence of focal bone lesion.  IMPRESSION: Comminuted, displaced intertrochanteric left femur fracture.   Electronically Signed   By: Marnee Spring M.D.   On: 09/17/2015 04:32   Dg Femur Port Min 2 Views Left  09/19/2015   CLINICAL DATA:  ORIF left femoral female placed today.  EXAM: LEFT FEMUR PORTABLE 2 VIEWS  COMPARISON:  Prior studies  FINDINGS: An intramedullary nail is identified within the left femur, traversing an intertrochanteric fracture.  No complicating features are identified.  IMPRESSION: Internal fixation of intertrochanteric femur fracture.   Electronically Signed   By: Harmon Pier M.D.   On: 09/19/2015 18:52    Microbiology: Recent Results (from the past 240 hour(s))  Surgical pcr screen     Status: None   Collection Time: 09/19/15 11:33 AM  Result Value Ref Range Status   MRSA, PCR NEGATIVE NEGATIVE Final   Staphylococcus aureus NEGATIVE NEGATIVE Final    Comment:        The Xpert SA Assay (FDA approved for NASAL specimens in patients over 54 years of age), is one component of a comprehensive surveillance program.  Test performance has been validated by [facility] for patients greater than or equal to 30 year old. It is not intended to diagnose infection nor to guide or monitor treatment.      Labs: Basic  Metabolic Panel:  Recent Labs Lab 09/17/15 0240 09/18/15 0958 09/20/15 0758 09/21/15 0610 09/22/15 0855  NA 131* 132* 129* 128* 137  K 4.5 4.8 4.0 3.4* 3.7  CL 98* 98* 93* 94* 101  CO2 24 27 25 28 30   GLUCOSE 193* 128* 109* 93 94  BUN 17 12 8 9 10   CREATININE 0.79 0.77 0.82 0.82 0.72  CALCIUM 8.6* 8.6* 7.9* 7.6* 8.3*   Liver Function Tests:  Recent Labs Lab 09/21/15 0610  AST 26  ALT 13*  ALKPHOS 52  BILITOT 0.8  PROT 5.9*  ALBUMIN 2.1*   No results for input(s): LIPASE, AMYLASE in the last 168 hours. No results for input(s): AMMONIA in the last 168 hours. CBC:  Recent Labs Lab 09/17/15 0240  09/19/15 0935 09/20/15 0758 09/21/15 0610 09/22/15 0648 09/22/15 0855  WBC 14.6*  < > 13.6* 9.5 6.6 7.2 7.5  NEUTROABS 13.9*  --  12.2*  --  4.7  --  5.9  HGB 11.4*  < > 9.3* 8.4* 7.1* 10.5* 10.8*  HCT 35.0*  < > 28.4* 25.5* 21.6* 32.4* 32.6*  MCV 88.6  < > 87.9 87.9 89.6 89.0 89.3  PLT 323  < > 304 269 220 268 272  < > = values in this interval not displayed. Cardiac Enzymes: No results for input(s): CKTOTAL, CKMB, CKMBINDEX, TROPONINI in the last 168 hours. BNP: BNP (last 3 results) No results for input(s): BNP in the last 8760 hours.  ProBNP (last 3 results) No results for input(s): PROBNP in the last 8760 hours.  CBG: No results for input(s): GLUCAP in the last 168 hours.     Signed12/03/16  [facility] 09/22/2015, 10:17 AM

## 2015-09-22 NOTE — Care Management Important Message (Signed)
Important Message  Patient Details  Name: Kristen [name] MRN: [id number] Date of Birth: 26-Dec-1934   Medicare Important Message Given:  Yes-second notification given    Orson Aloe 09/22/2015, 12:00 PM

## 2015-09-22 NOTE — Discharge Planning (Signed)
Patient to be discharged back to [facility]. Patient's sister updated regarding discharge.  Facility: [facility] RN report number: [phone] Transportation: EMS (scheduled for 1:30pm)  Marcelline Deist, Theresia Majors - [phone] Clinical Social Work Department Orthopedics [phone]) and Surgical [phone])

## 2015-09-23 ENCOUNTER — Other Ambulatory Visit: Payer: Self-pay | Admitting: *Deleted

## 2015-09-23 LAB — TYPE AND SCREEN
ABO/RH(D): O NEG
Antibody Screen: NEGATIVE
Unit division: 0
Unit division: 0

## 2015-09-23 MED ORDER — HYDROCODONE-ACETAMINOPHEN 5-325 MG PO TABS: ORAL_TABLET | ORAL | Status: DC

## 2015-09-23 NOTE — Anesthesia Postprocedure Evaluation (Signed)
  Anesthesia Post-op Note  Patient: Kristen [name]  Procedure(s) Performed: Procedure(s): INTRAMEDULLARY (IM) NAIL INTERTROCHANTRIC (Left)  Patient Location: PACU  Anesthesia Type:General  Level of Consciousness: awake and alert   Airway and Oxygen Therapy: Patient Spontanous Breathing  Post-op Pain: Controlled  Post-op Assessment: Post-op Vital signs reviewed, Patient's Cardiovascular Status Stable and Respiratory Function Stable  Post-op Vital Signs: Reviewed  Filed Vitals:   09/22/15 0625  BP: 113/58  Pulse: 80  Temp:   Resp: 16    Complications: No apparent anesthesia complications

## 2015-09-23 NOTE — Telephone Encounter (Signed)
[facility] 

## 2015-09-24 ENCOUNTER — Encounter: Payer: Self-pay | Admitting: Internal Medicine

## 2015-09-24 ENCOUNTER — Non-Acute Institutional Stay (SKILLED_NURSING_FACILITY): Payer: Medicare Other | Admitting: Internal Medicine

## 2015-09-24 DIAGNOSIS — R2681 Unsteadiness on feet: Secondary | ICD-10-CM

## 2015-09-24 DIAGNOSIS — M818 Other osteoporosis without current pathological fracture: Secondary | ICD-10-CM

## 2015-09-24 DIAGNOSIS — M05711 Rheumatoid arthritis with rheumatoid factor of right shoulder without organ or systems involvement: Secondary | ICD-10-CM

## 2015-09-24 DIAGNOSIS — K219 Gastro-esophageal reflux disease without esophagitis: Secondary | ICD-10-CM | POA: Diagnosis not present

## 2015-09-24 DIAGNOSIS — E46 Unspecified protein-calorie malnutrition: Secondary | ICD-10-CM | POA: Diagnosis not present

## 2015-09-24 DIAGNOSIS — K5900 Constipation, unspecified: Secondary | ICD-10-CM

## 2015-09-24 DIAGNOSIS — F329 Major depressive disorder, single episode, unspecified: Secondary | ICD-10-CM | POA: Diagnosis not present

## 2015-09-24 DIAGNOSIS — F32A Depression, unspecified: Secondary | ICD-10-CM

## 2015-09-24 DIAGNOSIS — S72102D Unspecified trochanteric fracture of left femur, subsequent encounter for closed fracture with routine healing: Secondary | ICD-10-CM | POA: Diagnosis not present

## 2015-09-24 DIAGNOSIS — D62 Acute posthemorrhagic anemia: Secondary | ICD-10-CM | POA: Diagnosis not present

## 2015-09-24 DIAGNOSIS — M810 Age-related osteoporosis without current pathological fracture: Secondary | ICD-10-CM

## 2015-09-24 DIAGNOSIS — F03C0 Unspecified dementia, severe, without behavioral disturbance, psychotic disturbance, mood disturbance, and anxiety: Secondary | ICD-10-CM

## 2015-09-24 DIAGNOSIS — J410 Simple chronic bronchitis: Secondary | ICD-10-CM

## 2015-09-24 DIAGNOSIS — I10 Essential (primary) hypertension: Secondary | ICD-10-CM | POA: Diagnosis not present

## 2015-09-24 DIAGNOSIS — F0390 Unspecified dementia without behavioral disturbance: Secondary | ICD-10-CM | POA: Diagnosis not present

## 2015-09-24 NOTE — Progress Notes (Signed)
This encounter was created in error - please disregard.

## 2015-09-24 NOTE — Progress Notes (Deleted)
Patient ID: Kristen [name], female   DOB: 06-09-1935, 80 y.o.   MRN: [id number]       [facility] PCP: [name], MD  Code Status: Full  Allergies  Allergen Reactions  . Erythromycin Base     Per MAR  . Macrodantin [Nitrofurantoin]   . Penicillins     Per [facility]    Chief Complaint  Patient presents with  . New Admit To SNF    New Admit     HPI:  80 y.o. patient is here for short term rehabilitation post hospital admission from   Review of Systems:  Constitutional: Negative for fever, chills, malaise/fatigue and diaphoresis.  HENT: Negative for headache, congestion, nasal discharge, hearing loss, earache, sore throat, difficulty swallowing.   Eyes: Negative for eye pain, blurred vision, double vision and discharge.  Respiratory: Negative for cough, shortness of breath and wheezing.   Cardiovascular: Negative for chest pain, palpitations, leg swelling.  Gastrointestinal: Negative for heartburn, nausea, vomiting, abdominal pain, loss of appetite, melena, diarrhea and constipation.  Genitourinary: Negative for dysuria, urgency, frequency, hematuria, incontinence and flank pain.  Musculoskeletal: Negative for back pain, falls, joint pain and myalgias. assistive device used Skin: Negative for itching, sores and rash.  Neurological: Negative for weakness,dizziness, tingling, focal weakness Psychiatric/Behavioral: Negative for depression, anxiety, insomnia and memory loss.    Past Medical History  Diagnosis Date  . RA (rheumatoid arthritis) (HCC) 05/15/2013  . HTN (hypertension) 05/15/2013  . GERD (gastroesophageal reflux disease) 05/15/2013  . COPD (chronic obstructive pulmonary disease) (HCC) 05/15/2013  . Unspecified constipation 05/15/2013  . UTI (urinary tract infection) 05/15/2013  . HCAP (healthcare-associated pneumonia) 01/13/2014   Past Surgical History  Procedure Laterality Date  . Intramedullary (im) nail intertrochanteric Left 09/19/2015    Procedure:  INTRAMEDULLARY (IM) NAIL INTERTROCHANTRIC;  Surgeon: Samson Frederic, MD;  Location: [facility] OR;  Service: Orthopedics;  Laterality: Left;   Social History:   reports that she has quit smoking. She does not have any smokeless tobacco history on file. She reports that she does not drink alcohol or use illicit drugs.  No family history on file.  Medications:   Medication List       This list is accurate as of: 09/24/15 10:54 AM.  Always use your most recent med list.               acetaminophen 325 MG tablet  Commonly known as:  TYLENOL  Take one tablet by mouth twice daily for rheumatoid arthritis     BIOFREEZE 4 % Gel  Generic drug:  Menthol (Topical Analgesic)  Apply topically every 6 (six) hours.     calcitonin (salmon) 200 UNIT/ACT nasal spray  Commonly known as:  MIACALCIN/FORTICAL  Place 1 spray into alternate nostrils daily.     calcium-vitamin D 500-200 MG-UNIT tablet  Commonly known as:  OSCAL WITH D  Take 1 tablet by mouth 2 (two) times daily.     cholecalciferol 1000 UNITS tablet  Commonly known as:  VITAMIN D  Take 1,000 Units by mouth daily.     Cranberry 200 MG Caps  Take 400 mg by mouth 2 (two) times daily.     DULoxetine 20 MG capsule  Commonly known as:  CYMBALTA  Take one capsule by mouth once daily for depression.     ferrous sulfate 220 (44 FE) MG/5ML solution  Take 325 mg by mouth 2 (two) times daily with a meal.     folic acid 1 MG tablet  Commonly known as:  FOLVITE  Take 1 mg by mouth 2 (two) times daily. 0800 & 1700     lisinopril 5 MG tablet  Commonly known as:  PRINIVIL,ZESTRIL  Take one tablet by mouth once daily for hypertension     methotrexate 2.5 MG tablet  Commonly known as:  RHEUMATREX  Take five tablets by mouth every week for rheumatoid arthritis     mirtazapine 15 MG tablet  Commonly known as:  REMERON  Take one tablet by mouth every night at bedtime for appetite stimulant     polyethylene glycol packet  Commonly known as:   MIRALAX / GLYCOLAX  Take 17 g by mouth every other day.     ranitidine 150 MG tablet  Commonly known as:  ZANTAC  Take 150 mg by mouth 2 (two) times daily.     sennosides-docusate sodium 8.6-50 MG tablet  Commonly known as:  SENOKOT-S  Take 2 tablets by mouth daily.     traMADol 50 MG tablet  Commonly known as:  ULTRAM  Take 1/2 tablet by mouth every 12 hours as needed for pain         Physical Exam: *** Filed Vitals:   09/24/15 1034  BP: 103/60  Pulse: 94  Temp: 98.1 F (36.7 C)  TempSrc: Oral  Resp: 18  Height: 5\' 5"  (1.651 m)  Weight: 104 lb (47.174 kg)  SpO2: 95%    General- elderly female, well built, in no acute distress Head- normocephalic, atraumatic Ears- left ear normal tympanic membrane and normal external ear canal , right ear normal tympanic membrane and normal external ear canal Nose- normal nasal mucosa, no maxillary or frontal sinus tenderness, no nasal discharge Throat- moist mucus membrane, normal oropharynx, dentition is  Eyes- PERRLA, EOMI, no pallor, no icterus, no discharge, normal conjunctiva, normal sclera Neck- no cervical lymphadenopathy, no supraclavicular lymphadenopathy, no thyromegaly, no jugular vein distension, no carotid bruit Chest- no chest wall deformities, no chest wall tenderness Breast- normal appearance, no masses or lumps on palpation, normal nipple and areola exam, no axillary lymphadenopathy Cardiovascular- normal s1,s2, no murmurs/ rubs/ gallops, dorsalis pedis and radial pulses, leg edema Respiratory- bilateral clear to auscultation, no wheeze, no rhonchi, no crackles, no use of accessory muscles Abdomen- bowel sounds present, soft, non tender, no organomegaly, no abdominal bruits, no guarding or rigidity, no CVA tenderness Pelvic exam- normal pelvic exam, no adenexal or cervical motion tenderness Musculoskeletal- able to move all 4 extremities, no spinal and paraspinal tenderness, normal back curvature, steady gait, no use  of assistive device, normal range of motion Neurological- no focal deficit, alert and oriented to person, place and time, normal reflexes, normal muscle strength, normal sensation to fine touch and vibration Skin- warm and dry Nails- hypertrophy, ingrown Psychiatry- normal mood and affect    Labs reviewed: Basic Metabolic Panel:  Recent Labs  0758 09/21/15 0610 09/22/15 0855  NA 129* 128* 137  K 4.0 3.4* 3.7  CL 93* 94* 101  CO2 25 28 30   GLUCOSE 109* 93 94  BUN 8 9 10   CREATININE 0.82 0.82 0.72  CALCIUM 7.9* 7.6* 8.3*   Liver Function Tests:  Recent Labs  11/07/14 09/21/15 0610  AST 24 26  ALT 18 13*  ALKPHOS 59 52  BILITOT  --  0.8  PROT  --  5.9*  ALBUMIN  --  2.1*   No results for input(s): LIPASE, AMYLASE in the last 8760 hours. No results for input(s): AMMONIA in the last  8760 hours. CBC:  Recent Labs  09/19/15 0935  09/21/15 0610 09/22/15 0648 09/22/15 0855  WBC 13.6*  < > 6.6 7.2 7.5  NEUTROABS 12.2*  --  4.7  --  5.9  HGB 9.3*  < > 7.1* 10.5* 10.8*  HCT 28.4*  < > 21.6* 32.4* 32.6*  MCV 87.9  < > 89.6 89.0 89.3  PLT 304  < > 220 268 272  < > = values in this interval not displayed. Cardiac Enzymes: No results for input(s): CKTOTAL, CKMB, CKMBINDEX, TROPONINI in the last 8760 hours. BNP: Invalid input(s): POCBNP CBG: No results for input(s): GLUCAP in the last 8760 hours.  Radiological Exams: Dg Chest 1 View  09/17/2015   CLINICAL DATA:  Unwitnessed fall.  EXAM: CHEST 1 VIEW  COMPARISON:  03/20/2014  FINDINGS: Lower lung volumes from prior exam. Cardiomediastinal contours are unchanged allowing for differences in technique and patient rotation. There is linear subsegmental atelectasis at the right lung base. No consolidation to suggest pneumonia. No pleural effusion or pneumothorax. Probable compression deformities of lower thoracic spine.  IMPRESSION: Hypoventilatory chest with subsegmental atelectasis at the right lung base.    Electronically Signed   By: Rubye Oaks M.D.   On: 09/17/2015 04:11   Dg Elbow Complete Left  09/17/2015   CLINICAL DATA:  Fall, posterior elbow pain  EXAM: LEFT ELBOW - COMPLETE 3+ VIEW  COMPARISON:  None.  FINDINGS: There is no evidence of fracture, dislocation, or joint effusion. There is no evidence of arthropathy or other focal bone abnormality. Soft tissues are unremarkable.Mild arthritic change.  IMPRESSION: No acute findings.   Electronically Signed   By: Esperanza Heir M.D.   On: 09/17/2015 10:57   Ct Head Wo Contrast  09/17/2015   CLINICAL DATA:  Unwitnessed fall just prior to arrival. Fall out of bed this evening.  EXAM: CT HEAD WITHOUT CONTRAST  TECHNIQUE: Contiguous axial images were obtained from the base of the skull through the vertex without intravenous contrast.  COMPARISON:  08/11/2013  FINDINGS: Diffuse cerebellar and cerebral atrophy. Advanced chronic small vessel ischemic change.No intracranial hemorrhage, mass effect, or midline shift. No hydrocephalus. The basilar cisterns are patent. No evidence of territorial infarct. No intracranial fluid collection. Calvarium is intact. Chronic sphenoid sinus disease, with near complete opacification of the left-sided sphenoid sinus and central calcification. Remaining paranasal sinuses are well-aerated. The mastoid air cells are well aerated.  IMPRESSION: 1.  No acute intracranial abnormality. 2. Atrophy and advanced chronic small vessel ischemia.   Electronically Signed   By: Rubye Oaks M.D.   On: 09/17/2015 03:10   Dg Hip Unilat With Pelvis 2-3 Views Left  09/17/2015   CLINICAL DATA:  80 year old female post unwitnessed fall, now with left hip pain.  EXAM: DG HIP (WITH OR WITHOUT PELVIS) 2-3V LEFT  COMPARISON:  None.  FINDINGS: There is a comminuted displaced fracture of the intertrochanteric left hip involving the lesser and greater trochanters. Mild proximal migration of the femoral shaft. The femoral head remains seated in the  acetabulum. No evidence of additional fracture. The bones are under mineralized.  IMPRESSION: Comminuted displaced intertrochanteric left hip fracture.   Electronically Signed   By: Rubye Oaks M.D.   On: 09/17/2015 03:07   Dg Femur Min 2 Views Left  09/17/2015   CLINICAL DATA:  Evaluate hip fracture  EXAM: LEFT FEMUR 2 VIEWS  COMPARISON:  Pelvis radiography from earlier today  FINDINGS: Limited study due to patient condition.  There is an acute  intertrochanteric left femur fracture with varus angulation and proximal migration of the distal fragment. Fracture is comminuted with isolated lesser trochanter fragment. Femoral head is located. No additional fracture is seen. Osteopenia without evidence of focal bone lesion.  IMPRESSION: Comminuted, displaced intertrochanteric left femur fracture.   Electronically Signed   By: Marnee Spring M.D.   On: 09/17/2015 04:32    EKG: Independently reviewed. ***  Assessment/Plan No problem-specific assessment & plan notes found for this encounter.      Goals of care: short term rehabilitation   Labs/tests ordered:  Family/ staff Communication: reviewed care plan with patient and nursing supervisor    [name], MD  [facility] [phone] (Monday-Friday 8 am - 5 pm) [phone] (afterhours)

## 2015-09-29 LAB — BASIC METABOLIC PANEL
BUN: 13 mg/dL (ref 4–21)
Creatinine: 0.8 mg/dL (ref <=1.1)
Glucose: 86 mg/dL
Potassium: 4.3 mmol/L (ref 3.4–5.3)
SODIUM: 135 mmol/L — AB (ref 137–147)

## 2015-10-06 DIAGNOSIS — M79671 Pain in right foot: Secondary | ICD-10-CM | POA: Diagnosis not present

## 2015-10-06 DIAGNOSIS — M79672 Pain in left foot: Secondary | ICD-10-CM | POA: Diagnosis not present

## 2015-10-06 DIAGNOSIS — B351 Tinea unguium: Secondary | ICD-10-CM | POA: Diagnosis not present

## 2015-10-23 ENCOUNTER — Non-Acute Institutional Stay (SKILLED_NURSING_FACILITY): Payer: Medicare Other | Admitting: Internal Medicine

## 2015-10-23 DIAGNOSIS — M818 Other osteoporosis without current pathological fracture: Secondary | ICD-10-CM

## 2015-10-23 DIAGNOSIS — K219 Gastro-esophageal reflux disease without esophagitis: Secondary | ICD-10-CM

## 2015-10-23 DIAGNOSIS — F0390 Unspecified dementia without behavioral disturbance: Secondary | ICD-10-CM | POA: Diagnosis not present

## 2015-10-23 DIAGNOSIS — F03C0 Unspecified dementia, severe, without behavioral disturbance, psychotic disturbance, mood disturbance, and anxiety: Secondary | ICD-10-CM

## 2015-10-23 DIAGNOSIS — M810 Age-related osteoporosis without current pathological fracture: Secondary | ICD-10-CM

## 2015-10-23 NOTE — Progress Notes (Signed)
Patient ID: Kristen [name], female   DOB: 22-Sep-1935, 80 y.o.   MRN: [id number]     [facility]  Code Status: Full Code   Chief Complaint  Patient presents with  . Medical Management of Chronic Issues    Allergies  Allergen Reactions  . Erythromycin Base     Per MAR  . Macrodantin [Nitrofurantoin]   . Penicillins     Per MAR    HPI 80 y/o female patient is seen for routine visit. She has advanced dementia and is a fall risk. She is s/p ORIF for left hip fracture last month. She has been at her baseline. No further falls reported. She denies any concerns this visit. She requires assistance with her ADLs.     Review of Systems   Constitutional: Negative for fever, chills HENT: Negative for congestion   Respiratory: Negative for cough, shortness of breath and wheezing.    Cardiovascular: Negative for chest pain, palpitations Gastrointestinal: Negative for heartburn, nausea, vomiting, abdominal pain.   Genitourinary: Negative for dysuria   Musculoskeletal: Negative for new falls. Has joint and back pain with hx of OA Skin: Negative for itching and rash.   Neurological: Negative for dizziness, headaches.   Psychiatric/Behavioral: Has memory loss.   Past medical history reviewed  Medication reviewed. See [facility]   Physical exam BP 108/73 mmHg  Pulse 75  Temp(Src) 98.3 F (36.8 C)  Resp 16  SpO2 95%  Constitutional: thin elderly female in no acute distress. HEENT: Normocephalic and atraumatic. No icterus or pallor Neck: Supple and nontender. No lymphadenopathy Cardiac: Normal S1, S2. RRR, systolic murmur. No edema.   Lungs: CTAB, no wheeze, rhonchi or crackles Abdomen: bowel sounds present, soft, nontender, nondistended. Musculoskeletal: able to move all extremities. Left hip ROM restricted. no contractures, distal pulses palpable, hair loss in LE, varicose veins present Skin: Warm and dry. No rash noted. No erythema.   Neurological: Alert and oriented  to self.   Labs 06/29/15 urine culture e.coli 05/08/15 wbc 6.1, hb 11.7, hct 35.3, plt 259, ferritin 27, %sat 14 03/10/15 iron 29, tibc 330, wbc 5.2, hb 10.7, hct 33.6, na 139, k 4.5, bun 11, cr 0.78, glu 77, ca 8.6, t.chol 188, tg 94, ldl 119, hdl 50, tsh 4.178 11/07/14 na 138, k 4.4, bun 14, cr 0.8, alb 3, rest of lft wnl, tsh 4.01 10/15/14 wbc 7.6, hb 10.5, hct 32.8, ncv 96.2, plt 285   Assessment/plan  Age related osteoporosis new fracture recently post fall. continue miacalcin nasal spray with vitamin d and calcium supplement, monitor, fall precautions  gerd Continue zantac 150 mg bid and monitor  Advanced dementia Chronic, no acute behavioral disturbance. To provide assistance with ADLs.   [name], MD  [facility] [phone] (Monday-Friday 8 am - 5 pm) [phone] (afterhours)       Continue tylenol 500 mg bid for pain and tramadol 25 mg q12h prn breakthrough pain and monitor

## 2015-10-26 DIAGNOSIS — K219 Gastro-esophageal reflux disease without esophagitis: Secondary | ICD-10-CM | POA: Insufficient documentation

## 2015-10-26 DIAGNOSIS — M810 Age-related osteoporosis without current pathological fracture: Secondary | ICD-10-CM | POA: Insufficient documentation

## 2015-10-26 DIAGNOSIS — F03C0 Unspecified dementia, severe, without behavioral disturbance, psychotic disturbance, mood disturbance, and anxiety: Secondary | ICD-10-CM | POA: Insufficient documentation

## 2015-10-26 DIAGNOSIS — F0390 Unspecified dementia without behavioral disturbance: Secondary | ICD-10-CM | POA: Insufficient documentation

## 2015-10-26 NOTE — Progress Notes (Signed)
Patient ID: Kristen [name], female   DOB: 30-Dec-1934, 80 y.o.   MRN: [id number]     Facility: [facility]    PCP: [name], MD  Code Status: full code  Allergies  Allergen Reactions  . Erythromycin Base     Per MAR  . Macrodantin [Nitrofurantoin]   . Penicillins     Per [facility]    Chief Complaint  Patient presents with  . Readmit To SNF     HPI:  80 y.o. patient is here for long term care post hospital admission from 09/17/15-09/22/15 with left hip fracture post fall. She underwent ORIF. She had post operative blood loss anemia and required 2 u prbc transfusion. She has advanced dementia, HTN, CKD, osteoporsois, copd among others. She is seen in her room today. She is pleasantly confused and participates some in HPI and ROS. No falls in the facility. Minimal participation with therapy. Pain under control per nursing.   Review of Systems:  Constitutional: Negative for fever, chills HENT: Negative for headache, congestion, nasal discharge Eyes: Negative for eye pain and discharge.  Respiratory: Negative for cough, shortness of breath and wheezing.   Cardiovascular: Negative for chest pain, palpitations, leg swelling.  Gastrointestinal: Negative for heartburn, nausea, vomiting, abdominal pain. Genitourinary: Negative for dysuria Musculoskeletal: Negative for falls in facility but is a high fall risk with advanced dementia Skin: Negative for itching, rash.  Neurological: Negative for dizziness Psychiatric/Behavioral: Negative for depression   Past Medical History  Diagnosis Date  . RA (rheumatoid arthritis) (HCC) 05/15/2013  . HTN (hypertension) 05/15/2013  . GERD (gastroesophageal reflux disease) 05/15/2013  . COPD (chronic obstructive pulmonary disease) (HCC) 05/15/2013  . Unspecified constipation 05/15/2013  . UTI (urinary tract infection) 05/15/2013  . HCAP (healthcare-associated pneumonia) 01/13/2014   Past Surgical History  Procedure Laterality  Date  . Intramedullary (im) nail intertrochanteric Left 09/19/2015    Procedure: INTRAMEDULLARY (IM) NAIL INTERTROCHANTRIC;  Surgeon: Samson Frederic, MD;  Location: [facility] OR;  Service: Orthopedics;  Laterality: Left;   Social History:   reports that she has quit smoking. She does not have any smokeless tobacco history on file. She reports that she does not drink alcohol or use illicit drugs.  No family history on file.  Medications:   Medication List       This list is accurate as of: 09/24/15 11:59 PM.  Always use your most recent med list.               acetaminophen 325 MG tablet  Commonly known as:  TYLENOL  Take one tablet by mouth twice daily for rheumatoid arthritis     BIOFREEZE 4 % Gel  Generic drug:  Menthol (Topical Analgesic)  Apply topically every 6 (six) hours.     calcitonin (salmon) 200 UNIT/ACT nasal spray  Commonly known as:  MIACALCIN/FORTICAL  Place 1 spray into alternate nostrils daily.     calcium-vitamin D 500-200 MG-UNIT tablet  Commonly known as:  OSCAL WITH D  Take 1 tablet by mouth 2 (two) times daily.     cholecalciferol 1000 UNITS tablet  Commonly known as:  VITAMIN D  Take 1,000 Units by mouth daily.     Cranberry 200 MG Caps  Take 400 mg by mouth 2 (two) times daily.     DULoxetine 20 MG capsule  Commonly known as:  CYMBALTA  Take one capsule by mouth once daily for depression.     ferrous sulfate 220 (44 FE) MG/5ML solution  Take 325 mg by mouth 2 (two) times daily with a meal.     folic acid 1 MG tablet  Commonly known as:  FOLVITE  Take 1 mg by mouth 2 (two) times daily. 0800 & 1700     lisinopril 5 MG tablet  Commonly known as:  PRINIVIL,ZESTRIL  Take one tablet by mouth once daily for hypertension     methotrexate 2.5 MG tablet  Commonly known as:  RHEUMATREX  Take five tablets by mouth every week for rheumatoid arthritis     mirtazapine 15 MG tablet  Commonly known as:  REMERON  Take one tablet by mouth every night at  bedtime for appetite stimulant     polyethylene glycol packet  Commonly known as:  MIRALAX / GLYCOLAX  Take 17 g by mouth every other day.     ranitidine 150 MG tablet  Commonly known as:  ZANTAC  Take 150 mg by mouth 2 (two) times daily.     sennosides-docusate sodium 8.6-50 MG tablet  Commonly known as:  SENOKOT-S  Take 2 tablets by mouth daily.     traMADol 50 MG tablet  Commonly known as:  ULTRAM  Take 1/2 tablet by mouth every 12 hours as needed for pain         Physical Exam: Filed Vitals:   09/24/15 1019  BP: 103/60  Pulse: 88  Temp: 98.1 F (36.7 C)  Resp: 18  SpO2: 95%    General- elderly female, thin built, frail, in no acute distress Head- normocephalic, atraumatic Nose- normal nasal mucosa, no maxillary or frontal sinus tenderness, no nasal discharge Throat- moist mucus membrane  Eyes- no pallor, no icterus, no discharge, normal conjunctiva, normal sclera Neck- no cervical lymphadenopathy Cardiovascular- normal s1,s2, systolic murmur +, palpable dorsalis pedis and radial pulses, trace leg edema Respiratory- bilateral clear to auscultation, no wheeze, no rhonchi, no crackles, no use of accessory muscles Abdomen- bowel sounds present, soft, non tender Musculoskeletal- able to move all 4 extremities, limited left leg range of motion  Neurological- no focal deficit, alert and oriented to person Skin- warm and dry, left hip surgical incision with glue and dressing in place   Labs reviewed: Basic Metabolic Panel:  Recent Labs  63/01/60 0758 09/21/15 0610 09/22/15 0855  NA 129* 128* 137  K 4.0 3.4* 3.7  CL 93* 94* 101  CO2 25 28 30   GLUCOSE 109* 93 94  BUN 8 9 10   CREATININE 0.82 0.82 0.72  CALCIUM 7.9* 7.6* 8.3*   Liver Function Tests:  Recent Labs  11/07/14 09/21/15 0610  AST 24 26  ALT 18 13*  ALKPHOS 59 52  BILITOT  --  0.8  PROT  --  5.9*  ALBUMIN  --  2.1*   No results for input(s): LIPASE, AMYLASE in the last 8760 hours. No  results for input(s): AMMONIA in the last 8760 hours. CBC:  Recent Labs  09/19/15 0935  09/21/15 0610 09/22/15 0648 09/22/15 0855  WBC 13.6*  < > 6.6 7.2 7.5  NEUTROABS 12.2*  --  4.7  --  5.9  HGB 9.3*  < > 7.1* 10.5* 10.8*  HCT 28.4*  < > 21.6* 32.4* 32.6*  MCV 87.9  < > 89.6 89.0 89.3  PLT 304  < > 220 268 272  < > = values in this interval not displayed.   Radiological Exams: Dg Chest 1 View  09/17/2015  CLINICAL DATA:  Unwitnessed fall. EXAM: CHEST 1 VIEW COMPARISON:  03/20/2014 FINDINGS: Lower  lung volumes from prior exam. Cardiomediastinal contours are unchanged allowing for differences in technique and patient rotation. There is linear subsegmental atelectasis at the right lung base. No consolidation to suggest pneumonia. No pleural effusion or pneumothorax. Probable compression deformities of lower thoracic spine. IMPRESSION: Hypoventilatory chest with subsegmental atelectasis at the right lung base. Electronically Signed   By: Rubye Oaks M.D.   On: 09/17/2015 04:11   Dg Elbow Complete Left  09/17/2015  CLINICAL DATA:  Fall, posterior elbow pain EXAM: LEFT ELBOW - COMPLETE 3+ VIEW COMPARISON:  None. FINDINGS: There is no evidence of fracture, dislocation, or joint effusion. There is no evidence of arthropathy or other focal bone abnormality. Soft tissues are unremarkable.Mild arthritic change. IMPRESSION: No acute findings. Electronically Signed   By: Esperanza Heir M.D.   On: 09/17/2015 10:57   Ct Head Wo Contrast  09/17/2015  CLINICAL DATA:  Unwitnessed fall just prior to arrival. Fall out of bed this evening. EXAM: CT HEAD WITHOUT CONTRAST TECHNIQUE: Contiguous axial images were obtained from the base of the skull through the vertex without intravenous contrast. COMPARISON:  08/11/2013 FINDINGS: Diffuse cerebellar and cerebral atrophy. Advanced chronic small vessel ischemic change.No intracranial hemorrhage, mass effect, or midline shift. No hydrocephalus. The basilar  cisterns are patent. No evidence of territorial infarct. No intracranial fluid collection. Calvarium is intact. Chronic sphenoid sinus disease, with near complete opacification of the left-sided sphenoid sinus and central calcification. Remaining paranasal sinuses are well-aerated. The mastoid air cells are well aerated. IMPRESSION: 1.  No acute intracranial abnormality. 2. Atrophy and advanced chronic small vessel ischemia. Electronically Signed   By: Rubye Oaks M.D.   On: 09/17/2015 03:10   Dg Hip Unilat With Pelvis 2-3 Views Left  09/17/2015  CLINICAL DATA:  80 year old female post unwitnessed fall, now with left hip pain. EXAM: DG HIP (WITH OR WITHOUT PELVIS) 2-3V LEFT COMPARISON:  None. FINDINGS: There is a comminuted displaced fracture of the intertrochanteric left hip involving the lesser and greater trochanters. Mild proximal migration of the femoral shaft. The femoral head remains seated in the acetabulum. No evidence of additional fracture. The bones are under mineralized. IMPRESSION: Comminuted displaced intertrochanteric left hip fracture. Electronically Signed   By: Rubye Oaks M.D.   On: 09/17/2015 03:07   Dg Femur Min 2 Views Left  09/17/2015  CLINICAL DATA:  Evaluate hip fracture EXAM: LEFT FEMUR 2 VIEWS COMPARISON:  Pelvis radiography from earlier today FINDINGS: Limited study due to patient condition. There is an acute intertrochanteric left femur fracture with varus angulation and proximal migration of the distal fragment. Fracture is comminuted with isolated lesser trochanter fragment. Femoral head is located. No additional fracture is seen. Osteopenia without evidence of focal bone lesion. IMPRESSION: Comminuted, displaced intertrochanteric left femur fracture. Electronically Signed   By: Marnee Spring M.D.   On: 09/17/2015 04:32     Assessment/Plan  Unsteady gait Post fall with left hip fracture. Will have patient work with PT/OT as tolerated to regain strength and  restore function.  Fall precautions are in place.  Left hip fracture S/p ORIF. Has f/u with orthopedics. Continue tylenol 325 mg bid and tramadol 25 mg q12h prn pain. To work with therapy team, fall precautions. Encourage to be OOB.   Blood loss anemia Post op and s/p 2 u prbc transfusion. Continue ferrous sulfate and monitor cbc  Protein calorie malnutrition Continue remeron 15 mg daily, encourage po intake, monitor weight, decline anticipated with her advanced dementia  Osteoporosis Continue oacal with  vitamin d supplement and calcitonin  HTN Stable bp, continue lisinopril 5 mg daily and monitor BP  Constipation Chronic, continue miralax and senokot s, encourage hydration  gerd Stable, continue zantac 150 mg bid  Dementia Without behavioral disturbance, decline anticipated, monitor po intake and weight. Pressure ulcer propylaxis. Assistance with ADLs.   Depression Chronic, stable, continue cymbalta 20 mg daily and remeron 15 mg daily  Copd breathing currently stable, continue o2 for now and monitor for early signs of exacerbation  RA Stable, continue methotrexate and folic acid supplement   Goals of care: short term rehabilitation   Labs/tests ordered: cbc, cmp  Family/ staff Communication: reviewed care plan with patient and nursing supervisor    [name], MD  [facility] [phone] (Monday-Friday 8 am - 5 pm) [phone] (afterhours)

## 2015-11-25 LAB — CBC AND DIFFERENTIAL
HEMATOCRIT: 37 % (ref 36–46)
Hemoglobin: 11.6 g/dL — AB (ref 12.0–16.0)
Platelets: 258 10*3/uL (ref 150–399)
WBC: 4.1 10^3/mL

## 2015-12-24 DIAGNOSIS — B351 Tinea unguium: Secondary | ICD-10-CM | POA: Diagnosis not present

## 2015-12-24 DIAGNOSIS — M79671 Pain in right foot: Secondary | ICD-10-CM | POA: Diagnosis not present

## 2015-12-24 DIAGNOSIS — M79672 Pain in left foot: Secondary | ICD-10-CM | POA: Diagnosis not present

## 2016-01-01 ENCOUNTER — Non-Acute Institutional Stay (SKILLED_NURSING_FACILITY): Payer: Medicare Other | Admitting: Internal Medicine

## 2016-01-01 DIAGNOSIS — M818 Other osteoporosis without current pathological fracture: Secondary | ICD-10-CM

## 2016-01-01 DIAGNOSIS — M810 Age-related osteoporosis without current pathological fracture: Secondary | ICD-10-CM

## 2016-01-01 DIAGNOSIS — W19XXXD Unspecified fall, subsequent encounter: Secondary | ICD-10-CM | POA: Diagnosis not present

## 2016-01-01 DIAGNOSIS — E46 Unspecified protein-calorie malnutrition: Secondary | ICD-10-CM | POA: Diagnosis not present

## 2016-01-01 DIAGNOSIS — M05711 Rheumatoid arthritis with rheumatoid factor of right shoulder without organ or systems involvement: Secondary | ICD-10-CM

## 2016-01-01 DIAGNOSIS — I952 Hypotension due to drugs: Secondary | ICD-10-CM | POA: Diagnosis not present

## 2016-01-01 DIAGNOSIS — D509 Iron deficiency anemia, unspecified: Secondary | ICD-10-CM | POA: Diagnosis not present

## 2016-01-01 NOTE — Progress Notes (Signed)
Patient ID: Kristen [name], female   DOB: [birth year], 81 y.o.   MRN: [id number]       [facility]: [employer]  Code Status: Full Code   Chief Complaint  Patient presents with  . Medical Management of Chronic Issues    Medical Management of Chronic Issues. [employer]    Allergies  Allergen Reactions  . Erythromycin Base     Per MAR  . Macrodantin [Nitrofurantoin]   . Penicillins     Per MAR    HPI 81 y/o female patient is seen for routine visit. She had a fall 2 days back. Her BP reading has been on lower side on review. She has advanced dementia and is a high fall risk. She is s/p ORIF for left hip fracture few month back. She has been at her baseline otherwise. She requires assistance with her ADLs.     Review of Systems   Constitutional: Negative for fever, chills HENT: Negative for congestion   Respiratory: Negative for cough, shortness of breath and wheezing.    Cardiovascular: Negative for chest pain, palpitations Gastrointestinal: Negative for heartburn, nausea, vomiting, abdominal pain.   Genitourinary: Negative for dysuria   Musculoskeletal: Has joint and back pain with hx of OA Skin: Negative for itching and rash.   Neurological: Negative for dizziness, headaches.   Psychiatric/Behavioral: Has memory loss.   Past medical history reviewed  Medication reviewed. See [facility]   Physical exam BP 99/60 mmHg  Pulse 85  Temp(Src) 97.1 F (36.2 C) (Oral)  Resp 19  Ht 5\' 5"  (1.651 m)  Wt 104 lb (47.174 kg)  BMI 17.31 kg/m2  SpO2 97%  Constitutional: thin elderly female in no acute distress. HEENT: Normocephalic and atraumatic. No icterus or pallor Neck: Supple and nontender. No lymphadenopathy Cardiac: Normal S1, S2. RRR, systolic murmur. No edema.   Lungs: CTAB, no wheeze, rhonchi or crackles Abdomen: bowel sounds present, soft, nontender, nondistended. Musculoskeletal: able to move all extremities. Left hip ROM restricted. no contractures, distal pulses  palpable, hair loss in LE, varicose veins present Skin: Warm and dry. No rash noted. No erythema.   Neurological: Alert and oriented to self.   Labs 06/29/15 urine culture e.coli 05/08/15 wbc 6.1, hb 11.7, hct 35.3, plt 259, ferritin 27, %sat 14 03/10/15 iron 29, tibc 330, wbc 5.2, hb 10.7, hct 33.6, na 139, k 4.5, bun 11, cr 0.78, glu 77, ca 8.6, t.chol 188, tg 94, ldl 119, hdl 50, tsh 4.178 11/07/14 na 138, k 4.4, bun 14, cr 0.8, alb 3, rest of lft wnl, tsh 4.01 10/15/14 wbc 7.6, hb 10.5, hct 32.8, ncv 96.2, plt 285   Assessment/plan  Hypotension Low bp reading, likely iatrogenic. Discontinue lisinopril for now. Check BP daily  Fall subsequent encounter No apparent injury on exam. Fall precautions. With her dementia at high fall risk. Her bp could be contributing some, med adjustment as above.  Age related osteoporosis With high fall risk. continue calcitonin nasal spray with vitamin d and calcium supplement, monitor, fall precautions  RA Continue methotrexate with prn tylenol and tramadol. Continue folic acid supplement  Iron def anemia Continue ferrous sulfate 325 mg bid  Protein calorie malnutrition Monitor weight. Continue nutritional supplement. Continue remeron 7.5 mg daily   Malichi Palardy, MD  [facility] [phone] (Monday-Friday 8 am - 5 pm) [phone] (afterhours)       Continue tylenol 500 mg bid for pain and tramadol 25 mg q12h prn breakthrough pain and monitor

## 2016-01-09 LAB — TSH: TSH: 2.86 u[IU]/mL (ref 0.41–5.90)

## 2016-01-09 LAB — CBC AND DIFFERENTIAL
HEMATOCRIT: 34 % — AB (ref 36–46)
HEMOGLOBIN: 10.8 g/dL — AB (ref 12.0–16.0)
PLATELETS: 271 10*3/uL (ref 150–399)
WBC: 5.4 10^3/mL

## 2016-01-09 LAB — LIPID PANEL
CHOLESTEROL: 169 mg/dL (ref 0–200)
HDL: 42 mg/dL (ref 35–70)
LDL Cholesterol: 109 mg/dL
Triglycerides: 90 mg/dL (ref 40–160)

## 2016-01-09 LAB — HEMOGLOBIN A1C: HEMOGLOBIN A1C: 5.2

## 2016-01-27 ENCOUNTER — Non-Acute Institutional Stay (SKILLED_NURSING_FACILITY): Payer: Medicare Other | Admitting: Internal Medicine

## 2016-01-27 ENCOUNTER — Encounter: Payer: Self-pay | Admitting: Internal Medicine

## 2016-01-27 DIAGNOSIS — S7292XS Unspecified fracture of left femur, sequela: Secondary | ICD-10-CM

## 2016-01-27 DIAGNOSIS — R634 Abnormal weight loss: Secondary | ICD-10-CM

## 2016-01-27 DIAGNOSIS — I10 Essential (primary) hypertension: Secondary | ICD-10-CM

## 2016-01-27 DIAGNOSIS — E46 Unspecified protein-calorie malnutrition: Secondary | ICD-10-CM

## 2016-01-27 DIAGNOSIS — K219 Gastro-esophageal reflux disease without esophagitis: Secondary | ICD-10-CM

## 2016-01-27 NOTE — Progress Notes (Signed)
Patient ID: Kristen [name], female   DOB: 1935-05-08, 81 y.o.   MRN: [id number]       [facility]: [employer]  Code Status: Full Code   Chief Complaint  Patient presents with  . Medical Management of Chronic Issues    Routine Visit    Allergies  Allergen Reactions  . Erythromycin Base     Per MAR  . Macrodantin [Nitrofurantoin]   . Penicillins     Per MAR    HPI 81 y/o female patient is seen for routine visit. At her baseline, pleasantly confused, refusing care at times. She has advanced dementia and is a high fall risk. She requires assistance with her ADLs.  Continues to loose weight   Review of Systems   Constitutional: Negative for fever, chills HENT: Negative for congestion   Respiratory: Negative for cough, shortness of breath.    Cardiovascular: Negative for chest pain Gastrointestinal: Negative for heartburn, nausea, vomiting, abdominal pain.   Genitourinary: Negative for dysuria   Musculoskeletal: Has joint and back pain with hx of OA   Past medical history reviewed  Medication reviewed. See [facility]   Medication List       This list is accurate as of: 01/27/16  6:14 PM.  Always use your most recent med list.               acetaminophen 500 MG tablet  Commonly known as:  TYLENOL  Take one tablet by mouth twice daily for pain     AMBULATORY NON FORMULARY MEDICATION  Medpass by mouth daily. Record % consumed due to weight loss     AMBULATORY NON FORMULARY MEDICATION  Magic Cup twice daily with lunch and evening meal     aspirin 325 MG tablet  Take one tablet by mouth daily for DVT proph.     BIOFREEZE 4 % Gel  Generic drug:  Menthol (Topical Analgesic)  Apply topically every 6 (six) hours.     calcitonin (salmon) 200 UNIT/ACT nasal spray  Commonly known as:  MIACALCIN/FORTICAL  Place 1 spray into alternate nostrils daily.     calcium-vitamin D 500-200 MG-UNIT tablet  Commonly known as:  OSCAL WITH D  Take 1 tablet by mouth 2  (two) times daily.     cholecalciferol 1000 units tablet  Commonly known as:  VITAMIN D  Take 1,000 Units by mouth daily.     Cranberry 200 MG Caps  Take 400 mg by mouth 2 (two) times daily.     DULoxetine 20 MG capsule  Commonly known as:  CYMBALTA  Take one capsule by mouth once daily for depression.     ferrous sulfate 220 (44 Fe) MG/5ML solution  Take 325 mg by mouth 2 (two) times daily with a meal.     folic acid 1 MG tablet  Commonly known as:  FOLVITE  Take 1 mg by mouth daily. 0800 & 1700     lidocaine 5 %  Commonly known as:  LIDODERM  Apply to lower back once daily. Rotate site daily. Remove & Discard patch within 12 hours or as directed by MD     methotrexate 2.5 MG tablet  Commonly known as:  RHEUMATREX  Take five tablets by mouth every week for rheumatoid arthritis     mirtazapine 15 MG tablet  Commonly known as:  REMERON  Take 1/2 tablet by mouth every night at bedtime for appetite stimulant     pantoprazole 40 MG tablet  Commonly  known as:  PROTONIX  Take one tablet by mouth daily for GI bleed     polyethylene glycol packet  Commonly known as:  MIRALAX / GLYCOLAX  Take 17 g by mouth every other day.     ranitidine 150 MG tablet  Commonly known as:  ZANTAC  Take 150 mg by mouth 2 (two) times daily.     sennosides-docusate sodium 8.6-50 MG tablet  Commonly known as:  SENOKOT-S  Take 2 tablets by mouth daily.     traMADol 50 MG tablet  Commonly known as:  ULTRAM  Take 1/2 tablet by mouth every 12 hours as needed for pain        Physical exam BP 104/62 mmHg  Pulse 70  Temp(Src) 97.1 F (36.2 C) (Oral)  Resp 18  Ht 5\' 5"  (1.651 m)  Wt 96 lb 12.8 oz (43.908 kg)  BMI 16.11 kg/m2  SpO2 96%  Wt Readings from Last 3 Encounters:  01/27/16 96 lb 12.8 oz (43.908 kg)  01/01/16 104 lb (47.174 kg)  09/24/15 104 lb (47.174 kg)   Constitutional: thin elderly female in no acute distress. HEENT: Normocephalic and atraumatic. No icterus or  pallor Neck: No lymphadenopathy Cardiac: Normal S1, S2. RRR, systolic murmur. No edema.   Lungs: CTAB, no wheeze, rhonchi or crackles Abdomen: bowel sounds present, soft, nontender, nondistended. Musculoskeletal: able to move all extremities. Left hip ROM restricted. no contractures, distal pulses palpable, hair loss in LE, varicose veins present Skin: Warm and dry. No rash noted. No erythema.   Neurological: Alert and oriented to self.   Labs CBC Latest Ref Rng 01/09/2016 11/25/2015 09/22/2015  WBC - 5.4 4.1 7.5  Hemoglobin 12.0 - 16.0 g/dL 10.8(A) 11.6(A) 10.8(L)  Hematocrit 36 - 46 % 34(A) 37 32.6(L)  Platelets 150 - 399 K/L 271 258 272   CMP Latest Ref Rng 09/29/2015 09/22/2015 09/21/2015  Glucose 65 - 99 mg/dL - 94 93  BUN 4 - 21 mg/dL 13 10 9   Creatinine .5 - 1.1 mg/dL 0.8 11/21/2015  Sodium 2.99 - 147 mmol/L 135(A) 137 128(L)  Potassium 3.4 - 5.3 mmol/L 4.3 3.7 3.4(L)  Chloride 101 - 111 mmol/L - 101 94(L)  CO2 22 - 32 mmol/L - 30 28  Calcium 8.9 - 10.3 mg/dL - 8.3(L) 7.6(L)  Total Protein 6.5 - 8.1 g/dL - - 5.9(L)  Total Bilirubin 0.3 - 1.2 mg/dL - - 0.8  Alkaline Phos 38 - 126 U/L - - 52  AST 15 - 41 U/L - - 26  ALT 14 - 54 U/L - - 13(L)   Lipid Panel     Component Value Date/Time   CHOL 169 01/09/2016   TRIG 90 01/09/2016   HDL 42 01/09/2016   LDLCALC 109 01/09/2016    Assessment/plan  Protein calorie malnutrition Losing weight. Decline anticipated with her advanced dementia. Continue fortified food and on mechanical soft diet for now  Weight loss With protein calorie malnutrition. Decline anticipated.   HTN Stable bp reading, off antihypertensive medication for now. Monitor bp  GERD Currently on protonix 40 mg daily and zantac 150 mg bid. Supposed to be on PPI x 8 weeks with her being on aspirin to prevent gastritis. Change protonix to 20 mg daily for a week and stop it. Check cbc in 1 month  Left femur fracture sequalae Currently on aspirin 325 mg daily,  was to be on it for a month post surgery. Discontinue this.    01/11/2016, MD  01/11/2016 Adult  Medicine [phone] (Monday-Friday 8 am - 5 pm) [phone] (afterhours)

## 2016-01-31 ENCOUNTER — Encounter (HOSPITAL_COMMUNITY): Payer: Self-pay

## 2016-01-31 ENCOUNTER — Emergency Department (HOSPITAL_COMMUNITY): Payer: Medicare Other

## 2016-01-31 ENCOUNTER — Emergency Department (HOSPITAL_COMMUNITY)
Admission: EM | Admit: 2016-01-31 | Discharge: 2016-02-01 | Disposition: A | Discharge status: Home / Self Care | Payer: Medicare Other | Attending: Emergency Medicine | Admitting: Emergency Medicine

## 2016-01-31 DIAGNOSIS — F0390 Unspecified dementia without behavioral disturbance: Secondary | ICD-10-CM | POA: Diagnosis not present

## 2016-01-31 DIAGNOSIS — Z87891 Personal history of nicotine dependence: Secondary | ICD-10-CM | POA: Diagnosis not present

## 2016-01-31 DIAGNOSIS — Z79899 Other long term (current) drug therapy: Secondary | ICD-10-CM | POA: Insufficient documentation

## 2016-01-31 DIAGNOSIS — N390 Urinary tract infection, site not specified: Secondary | ICD-10-CM | POA: Insufficient documentation

## 2016-01-31 DIAGNOSIS — Z7952 Long term (current) use of systemic steroids: Secondary | ICD-10-CM | POA: Insufficient documentation

## 2016-01-31 DIAGNOSIS — Z880 Allergy status to penicillin: Secondary | ICD-10-CM | POA: Diagnosis not present

## 2016-01-31 DIAGNOSIS — R4189 Other symptoms and signs involving cognitive functions and awareness: Secondary | ICD-10-CM

## 2016-01-31 DIAGNOSIS — Z792 Long term (current) use of antibiotics: Secondary | ICD-10-CM | POA: Diagnosis not present

## 2016-01-31 DIAGNOSIS — I10 Essential (primary) hypertension: Secondary | ICD-10-CM | POA: Diagnosis not present

## 2016-01-31 DIAGNOSIS — R4182 Altered mental status, unspecified: Secondary | ICD-10-CM | POA: Insufficient documentation

## 2016-01-31 DIAGNOSIS — R55 Syncope and collapse: Secondary | ICD-10-CM | POA: Diagnosis not present

## 2016-01-31 DIAGNOSIS — K219 Gastro-esophageal reflux disease without esophagitis: Secondary | ICD-10-CM | POA: Insufficient documentation

## 2016-01-31 DIAGNOSIS — M069 Rheumatoid arthritis, unspecified: Secondary | ICD-10-CM | POA: Diagnosis not present

## 2016-01-31 DIAGNOSIS — J449 Chronic obstructive pulmonary disease, unspecified: Secondary | ICD-10-CM | POA: Insufficient documentation

## 2016-01-31 DIAGNOSIS — Z8701 Personal history of pneumonia (recurrent): Secondary | ICD-10-CM | POA: Insufficient documentation

## 2016-01-31 DIAGNOSIS — R569 Unspecified convulsions: Secondary | ICD-10-CM | POA: Diagnosis present

## 2016-01-31 LAB — CBC WITH DIFFERENTIAL/PLATELET
BASOS ABS: 0 10*3/uL (ref 0.0–0.1)
BASOS PCT: 0 %
EOS ABS: 0 10*3/uL (ref 0.0–0.7)
EOS PCT: 0 %
HCT: 38.9 % (ref 36.0–46.0)
Hemoglobin: 12.6 g/dL (ref 12.0–15.0)
LYMPHS PCT: 4 %
Lymphs Abs: 0.5 10*3/uL — ABNORMAL LOW (ref 0.7–4.0)
MCH: 29.7 pg (ref 26.0–34.0)
MCHC: 32.4 g/dL (ref 30.0–36.0)
MCV: 91.7 fL (ref 78.0–100.0)
Monocytes Absolute: 0.2 10*3/uL (ref 0.1–1.0)
Monocytes Relative: 2 %
Neutro Abs: 11 10*3/uL — ABNORMAL HIGH (ref 1.7–7.7)
Neutrophils Relative %: 94 %
PLATELETS: 299 10*3/uL (ref 150–400)
RBC: 4.24 MIL/uL (ref 3.87–5.11)
RDW: 13.4 % (ref 11.5–15.5)
WBC: 11.7 10*3/uL — AB (ref 4.0–10.5)

## 2016-01-31 LAB — COMPREHENSIVE METABOLIC PANEL
ALBUMIN: 3.2 g/dL — AB (ref 3.5–5.0)
ALT: 11 U/L — AB (ref 14–54)
AST: 26 U/L (ref 15–41)
Alkaline Phosphatase: 86 U/L (ref 38–126)
Anion gap: 15 (ref 5–15)
BUN: 12 mg/dL (ref 6–20)
CHLORIDE: 99 mmol/L — AB (ref 101–111)
CO2: 21 mmol/L — AB (ref 22–32)
CREATININE: 1 mg/dL (ref 0.44–1.00)
Calcium: 9.2 mg/dL (ref 8.9–10.3)
GFR calc Af Amer: >60 mL/min (ref >60)
GFR calc non Af Amer: 53 mL/min — ABNORMAL LOW (ref >60)
Glucose, Bld: 181 mg/dL — ABNORMAL HIGH (ref 65–99)
Potassium: 3.7 mmol/L (ref 3.5–5.1)
SODIUM: 135 mmol/L (ref 135–145)
Total Bilirubin: 0.7 mg/dL (ref 0.3–1.2)
Total Protein: 7.8 g/dL (ref 6.5–8.1)

## 2016-01-31 LAB — URINALYSIS, ROUTINE W REFLEX MICROSCOPIC
BILIRUBIN URINE: NEGATIVE
Glucose, UA: NEGATIVE mg/dL
Ketones, ur: 15 mg/dL — AB
NITRITE: POSITIVE — AB
PH: 5.5 (ref 5.0–8.0)
Protein, ur: NEGATIVE mg/dL
SPECIFIC GRAVITY, URINE: 1.015 (ref 1.005–1.030)

## 2016-01-31 LAB — URINE MICROSCOPIC-ADD ON: Squamous Epithelial / LPF: NONE SEEN

## 2016-01-31 LAB — CBG MONITORING, ED: Glucose-Capillary: 168 mg/dL — ABNORMAL HIGH (ref 65–99)

## 2016-01-31 MED ORDER — SODIUM CHLORIDE 0.9 % IV BOLUS (SEPSIS)
500.0000 mL | Freq: Once | INTRAVENOUS | Status: AC
  Administered 2016-01-31: 500 mL via INTRAVENOUS

## 2016-01-31 MED ORDER — DEXTROSE 5 % IV SOLN
1.0000 g | Freq: Once | INTRAVENOUS | Status: AC
  Administered 2016-01-31: 1 g via INTRAVENOUS
  Filled 2016-01-31: qty 10

## 2016-01-31 MED ORDER — CEPHALEXIN 500 MG PO CAPS: 500.0000 mg | ORAL_CAPSULE | Freq: Three times a day (TID) | ORAL | Status: DC

## 2016-01-31 NOTE — ED Notes (Signed)
CT tech called pt vomited x 3 while in CT.

## 2016-01-31 NOTE — ED Provider Notes (Signed)
CSN: [id number]     Arrival date & time 01/31/16  1107 History   First MD Initiated Contact with Patient 01/31/16 1108     Chief Complaint  Patient presents with  . Seizures     (Consider location/radiation/quality/duration/timing/severity/associated sxs/prior Treatment) HPI Comments: Sister reports this episode is similar to others, when she has been diagnosed with UTI Becomes altered, then receives abx and improves   Patient is a 81 y.o. female presenting with seizures.  Seizures Seizure activity on arrival: no   Seizure type:  Grand mal Initial focality:  Unable to specify Episode characteristics: generalized shaking   Postictal symptoms: somnolence   Return to baseline: no   Duration:  30 seconds Number of seizures this episode:  1 Recent head injury:  During the event (fell from bed) PTA treatment:  None History of seizures: yes (sister reports she has had "spells" in the past, only when she has UTIs, and episodes the facility has reported as seizure, however she reports they don't appear to be, and that these episodes always happen when she has a UITI,)     Past Medical History  Diagnosis Date  . RA (rheumatoid arthritis) (HCC) 05/15/2013  . HTN (hypertension) 05/15/2013  . GERD (gastroesophageal reflux disease) 05/15/2013  . COPD (chronic obstructive pulmonary disease) (HCC) 05/15/2013  . Unspecified constipation 05/15/2013  . UTI (urinary tract infection) 05/15/2013  . HCAP (healthcare-associated pneumonia) 01/13/2014   Past Surgical History  Procedure Laterality Date  . Intramedullary (im) nail intertrochanteric Left 09/19/2015    Procedure: INTRAMEDULLARY (IM) NAIL INTERTROCHANTRIC;  Surgeon: Samson Frederic, MD;  Location: [facility] OR;  Service: Orthopedics;  Laterality: Left;   History reviewed. No pertinent family history. Social History  Substance Use Topics  . Smoking status: Former Games developer  . Smokeless tobacco: None  . Alcohol Use: No   OB History    No data  available     Review of Systems  Unable to perform ROS: Mental status change  Neurological: Positive for seizures (possible seizure like activity per faciliy) and syncope.      Allergies  Erythromycin base; Macrodantin; and Penicillins  Home Medications   Prior to Admission medications   Medication Sig Start Date End Date Taking? Authorizing Provider  acetaminophen (TYLENOL) 500 MG tablet Take 500 mg by mouth 2 (two) times daily.    Yes Historical Provider, MD  calcitonin, salmon, (MIACALCIN/FORTICAL) 200 UNIT/ACT nasal spray Place 1 spray into alternate nostrils daily.   Yes Historical Provider, MD  CALCIUM PO Take 500 mg by mouth 2 (two) times daily.   Yes Historical Provider, MD  cholecalciferol (VITAMIN D) 1000 UNITS tablet Take 1,000 Units by mouth daily.   Yes Historical Provider, MD  Cranberry 200 MG CAPS Take 400 mg by mouth 2 (two) times daily.   Yes Historical Provider, MD  DULoxetine (CYMBALTA) 20 MG capsule Take 20 mg by mouth daily. for depression.   Yes Historical Provider, MD  ferrous sulfate 220 (44 FE) MG/5ML solution Take 330 mg by mouth 2 (two) times daily with a meal. 7.5 mls   Yes Historical Provider, MD  folic acid (FOLVITE) 1 MG tablet Take 1 mg by mouth daily.    Yes [name], NP  lidocaine (LIDODERM) 5 % Place 1 patch onto the skin daily. Apply to lower back once daily. Rotate site daily. Remove & Discard patch within 12 hours (nightly) or as directed by MD   Yes Historical Provider, MD  Menthol, Topical Analgesic, (BIOFREEZE) 4 % GEL  Apply 1 application topically every 6 (six) hours. Apply to painful areas/ joints   Yes Historical Provider, MD  methotrexate (RHEUMATREX) 2.5 MG tablet Take 12.5 mg by mouth every Monday. Take five tablets by mouth every week for rheumatoid arthritis   Yes [name], NP  mirtazapine (REMERON) 15 MG tablet Take 7.5 mg by mouth at bedtime. for appetite stimulant   Yes Historical Provider, MD  Nutritional Supplements  (NUTRITIONAL SUPPLEMENT PO) Take 120 mLs by mouth daily. Medpass - due to significant weight loss   Yes Historical Provider, MD  Nutritional Supplements (NUTRITIONAL SUPPLEMENT PO) Take 1 each by mouth 2 (two) times daily before lunch and supper. Magic Cup   Yes Historical Provider, MD  pantoprazole (PROTONIX) 20 MG tablet Take 20 mg by mouth See admin instructions. Ordered 01/28/16: take 1 tablet (20 mg) daily for 1 week, then discontinue   Yes Historical Provider, MD  polyethylene glycol (MIRALAX / GLYCOLAX) packet Take 17 g by mouth every other day. Mix in 8 oz of water or juice and drink   Yes Historical Provider, MD  ranitidine (ZANTAC) 150 MG tablet Take 150 mg by mouth 2 (two) times daily.   Yes Historical Provider, MD  sennosides-docusate sodium (SENOKOT-S) 8.6-50 MG tablet Take 2 tablets by mouth daily.   Yes Historical Provider, MD  traMADol (ULTRAM) 50 MG tablet Take 25 mg by mouth every 12 (twelve) hours as needed (pain). Take 1/2 tablet by mouth every 12 hours as needed for pain   Yes Historical Provider, MD  triamcinolone ointment (KENALOG) 0.1 % Apply 1 application topically See admin instructions. Apply to back twice daily until rash is healed   Yes Historical Provider, MD  cephALEXin (KEFLEX) 500 MG capsule Take 1 capsule (500 mg total) by mouth 3 (three) times daily. 01/31/16   Alvira Monday, MD   BP 105/75 mmHg  Pulse 64  Temp(Src) 97.6 F (36.4 C) (Rectal)  Resp 18  SpO2 98% Physical Exam  Constitutional: She appears well-developed and well-nourished. No distress.  HENT:  Head: Normocephalic and atraumatic.  Eyes: Conjunctivae and EOM are normal.  Neck: Normal range of motion.  Cardiovascular: Normal rate, regular rhythm, normal heart sounds and intact distal pulses.  Exam reveals no gallop and no friction rub.   No murmur heard. Pulmonary/Chest: Effort normal and breath sounds normal. No respiratory distress. She has no wheezes. She has no rales.  Abdominal: Soft. She  exhibits no distension. There is no tenderness. There is no guarding.  Musculoskeletal: She exhibits no edema or tenderness.  Neurological: She is alert. She has normal strength. No cranial nerve deficit or sensory deficit. GCS eye subscore is 3. GCS verbal subscore is 4. GCS motor subscore is 5.  On initial exam, will not follow commands, however able to move all 4 ext, on reevaluation follows some commands and neurologic exam grossly nonfocal   Skin: Skin is warm and dry. No rash noted. She is not diaphoretic. No erythema.  Nursing note and vitals reviewed.   ED Course  Procedures (including critical care time) Labs Review Labs Reviewed  CBC WITH DIFFERENTIAL/PLATELET - Abnormal; Notable for the following:    WBC 11.7 (*)    Neutro Abs 11.0 (*)    Lymphs Abs 0.5 (*)    All other components within normal limits  COMPREHENSIVE METABOLIC PANEL - Abnormal; Notable for the following:    Chloride 99 (*)    CO2 21 (*)    Glucose, Bld 181 (*)  Albumin 3.2 (*)    ALT 11 (*)    GFR calc non Af Amer 53 (*)    All other components within normal limits  URINALYSIS, ROUTINE W REFLEX MICROSCOPIC (NOT AT [facility]) - Abnormal; Notable for the following:    APPearance CLOUDY (*)    Hgb urine dipstick LARGE (*)    Ketones, ur 15 (*)    Nitrite POSITIVE (*)    Leukocytes, UA SMALL (*)    All other components within normal limits  URINE MICROSCOPIC-ADD ON - Abnormal; Notable for the following:    Bacteria, UA MANY (*)    All other components within normal limits  CBG MONITORING, ED - Abnormal; Notable for the following:    Glucose-Capillary 168 (*)    All other components within normal limits  URINE CULTURE    Imaging Review Ct Head Wo Contrast  01/31/2016  CLINICAL DATA:  Seizure with questionable fall EXAM: CT HEAD WITHOUT CONTRAST CT CERVICAL SPINE WITHOUT CONTRAST TECHNIQUE: Multidetector CT imaging of the head and cervical spine was performed following the standard protocol without  intravenous contrast. Multiplanar CT image reconstructions of the cervical spine were also generated. COMPARISON:  CT cervical spine July 29, 2012; head CT September 17, 2015 FINDINGS: CT HEAD FINDINGS Moderate diffuse atrophy is stable. There is no intracranial mass hemorrhage, extra-axial fluid collection, or midline shift. There is patchy small vessel disease throughout the centra semiovale bilaterally, stable. No new gray-white compartment lesions are identified. There is no acute appearing infarct. The bony calvarium appears intact. The mastoid air cells are clear. There is extensive sphenoid sinus disease bilaterally, more pronounced on the left than on the right, stable. There are foci of calcification in the left sphenoid sinus region. No intraorbital lesions are apparent. CT CERVICAL SPINE FINDINGS There is no fracture. There is minimal retrolisthesis of C4 on C5, likely due to underlying spondylosis. No other spondylolisthesis is appreciable. Prevertebral soft tissues and predental space regions are normal. There is moderate disc space narrowing at C3-4, C4-5, and C6-7. There is facet hypertrophy at multiple levels bilaterally. There is no disc extrusion or stenosis. There are scattered foci of carotid artery calcification bilaterally. There is scarring in each lung apex. IMPRESSION: CT head: Stable atrophy with extensive periventricular small vessel disease. No intracranial mass, hemorrhage, or extra-axial fluid collection. No acute appearing infarct. Chronic sphenoid sinus disease bilaterally, more severe on the left than on the right. Calcification in the left sphenoid sinus potentially may represent chronic fungal colonization. CT cervical spine: Osteoarthritic changes several levels. Minimal spondylolisthesis at C4-5 is felt to be due to underlying spondylosis. No other spondylolisthesis. No fracture evident. Foci of carotid artery calcification noted bilaterally. Electronically Signed   By: Bretta Bang III M.D.   On: 01/31/2016 12:39   Ct Cervical Spine Wo Contrast  01/31/2016  CLINICAL DATA:  Seizure with questionable fall EXAM: CT HEAD WITHOUT CONTRAST CT CERVICAL SPINE WITHOUT CONTRAST TECHNIQUE: Multidetector CT imaging of the head and cervical spine was performed following the standard protocol without intravenous contrast. Multiplanar CT image reconstructions of the cervical spine were also generated. COMPARISON:  CT cervical spine July 29, 2012; head CT September 17, 2015 FINDINGS: CT HEAD FINDINGS Moderate diffuse atrophy is stable. There is no intracranial mass hemorrhage, extra-axial fluid collection, or midline shift. There is patchy small vessel disease throughout the centra semiovale bilaterally, stable. No new gray-white compartment lesions are identified. There is no acute appearing infarct. The bony calvarium appears intact. The mastoid  air cells are clear. There is extensive sphenoid sinus disease bilaterally, more pronounced on the left than on the right, stable. There are foci of calcification in the left sphenoid sinus region. No intraorbital lesions are apparent. CT CERVICAL SPINE FINDINGS There is no fracture. There is minimal retrolisthesis of C4 on C5, likely due to underlying spondylosis. No other spondylolisthesis is appreciable. Prevertebral soft tissues and predental space regions are normal. There is moderate disc space narrowing at C3-4, C4-5, and C6-7. There is facet hypertrophy at multiple levels bilaterally. There is no disc extrusion or stenosis. There are scattered foci of carotid artery calcification bilaterally. There is scarring in each lung apex. IMPRESSION: CT head: Stable atrophy with extensive periventricular small vessel disease. No intracranial mass, hemorrhage, or extra-axial fluid collection. No acute appearing infarct. Chronic sphenoid sinus disease bilaterally, more severe on the left than on the right. Calcification in the left sphenoid sinus  potentially may represent chronic fungal colonization. CT cervical spine: Osteoarthritic changes several levels. Minimal spondylolisthesis at C4-5 is felt to be due to underlying spondylosis. No other spondylolisthesis. No fracture evident. Foci of carotid artery calcification noted bilaterally. Electronically Signed   By: Bretta Bang III M.D.   On: 01/31/2016 12:39   I have personally reviewed and evaluated these images and lab results as part of my medical decision-making.   EKG Interpretation   Date/Time:  Saturday January 31 2016 11:19:18 EST Ventricular Rate:  103 PR Interval:  211 QRS Duration: 98 QT Interval:  363 QTC Calculation: 475 R Axis:   32 Text Interpretation:  Sinus tachycardia Borderline prolonged PR interval  Low voltage, extremity leads Minimal ST depression, inferior leads ED  PHYSICIAN INTERPRETATION AVAILABLE IN [facility] Confirmed by TEST,  Record ([id number]) on 02/01/2016 11:06:08 AM      MDM   Final diagnoses:  UTI (lower urinary tract infection)  Spell of altered cognition, possible seizure   81 year old female with a history of rheumatoid arthritis, hypertension, GERD, COPD, dementia living in a skilled nursing facility presents with concern of possible seizure-like activity and fall from the bed. Per sister patient has had multiple similar episodes, or facility describes seizure-like activity, however this is always when patient has a urinary tract infection. She has no other known seizure disorder has never seen a neurologist for this, and sister reports she only has spells like this with UTIs.  CT head and cervical spine done and within normal limits. Labs are within normal limits. Patient's mental status improved in the emergency department. Urinalysis consistent with UTI.  Patient was significantly improved mental status, answering questions, however still not oriented to self.  Sister reports her mental status and improvement are consistent with  prior episodes. Given patient's nonfocal neurologic exam, have low suspicion for stroke. No recent medication changes, no pneumonia. Patient is improving following this episode, no other signs of seizure, and discussed possibility of admission with sister as patient is not quite back to baseline, however sister feels this is consistent with prior episodes, is improving and prefers for her to go home.  Feel comfortable with discharge and continued outpatient antibiotics, given prior clinical course with similar episodes, and laboratory and imaging findings, have low suspicion for other etiologies of patient's altered mental status, with possible seizure and UTI, steadily improving mental status to baseline.  Patient discharged in stable condition with understanding of reasons to return.     Alvira Monday, MD 02/01/16 1735

## 2016-01-31 NOTE — ED Notes (Addendum)
To room via EMS.  Pt had witnessed 30 second seizure witnessed by nursing staff.  Pt was found sitting on floor earlier this morning.  Pt is usually talkative.  Pt opens eyes to touch. Dressing with blood noted to back of right arm dated today.

## 2016-02-01 NOTE — ED Notes (Signed)
Report given to PTAR 

## 2016-02-03 ENCOUNTER — Telehealth: Payer: Self-pay | Admitting: Internal Medicine

## 2016-02-03 LAB — CBC AND DIFFERENTIAL
HCT: 37 % (ref 36–46)
Hemoglobin: 3.9 g/dL — AB (ref 12.0–16.0)
PLATELETS: 286 10*3/uL (ref 150–399)
WBC: 6.6 10*3/mL

## 2016-02-03 LAB — URINE CULTURE: Culture: >=100000

## 2016-02-03 NOTE — Telephone Encounter (Signed)
Possible re-admission to facility  - This is a patient you were seeing at [facility]** - [facility] fu is needed IF patient was re-admitted to facility upon discharge. Hospital discharge **02/01/16**

## 2016-02-04 ENCOUNTER — Telehealth (HOSPITAL_BASED_OUTPATIENT_CLINIC_OR_DEPARTMENT_OTHER): Payer: Self-pay | Admitting: Emergency Medicine

## 2016-02-04 NOTE — Telephone Encounter (Signed)
Post ED Visit - Positive Culture Follow-up  Culture report reviewed by antimicrobial stewardship pharmacist:  []  , Pharm.D. []  Enzo Bi, Pharm.D., BCPS []  , Pharm.D. [x]  Celedonio Miyamoto, Pharm.D., BCPS []  Hobbs, Garvin Fila.D., BCPS, AAHIVP []  , Pharm.D., BCPS, AAHIVP []  Georgina Pillion, Pharm.D. []  , [place].D.  Positive urine culture E. coli Treated with cephalexin, organism sensitive to the same and no further patient follow-up is required at this time.  [place] 02/04/2016, 9:22 AM

## 2016-03-03 ENCOUNTER — Encounter: Payer: Self-pay | Admitting: Internal Medicine

## 2016-03-03 ENCOUNTER — Non-Acute Institutional Stay (SKILLED_NURSING_FACILITY): Payer: Medicare Other | Admitting: Internal Medicine

## 2016-03-03 DIAGNOSIS — F32A Depression, unspecified: Secondary | ICD-10-CM

## 2016-03-03 DIAGNOSIS — F329 Major depressive disorder, single episode, unspecified: Secondary | ICD-10-CM

## 2016-03-03 DIAGNOSIS — R05 Cough: Secondary | ICD-10-CM | POA: Diagnosis not present

## 2016-03-03 DIAGNOSIS — R410 Disorientation, unspecified: Secondary | ICD-10-CM | POA: Diagnosis not present

## 2016-03-03 DIAGNOSIS — N390 Urinary tract infection, site not specified: Secondary | ICD-10-CM

## 2016-03-03 DIAGNOSIS — R059 Cough, unspecified: Secondary | ICD-10-CM

## 2016-03-03 NOTE — Progress Notes (Signed)
Patient ID: Kristen [name], female   DOB: 1935-02-09, 81 y.o.   MRN: [id number]       [facility]: [employer]  Code Status: Full Code   Chief Complaint  Patient presents with  . Medical Management of Chronic Issues    Routine Visit    Allergies  Allergen Reactions  . Erythromycin Base     Per MAR  . Macrodantin [Nitrofurantoin] Other (See Comments)    Unknown reaction  . Penicillins     Per MAR    HPI 81 y/o female patient is seen for routine visit. She has been lethargic and more confused lately per her caregiver. She is currently on protein supplement. She has been having increased cough for few days. She has advanced dementia and is a high fall risk. She requires assistance with her ADLs.   Review of Systems  : unable to obtain with her dementia   Past medical history reviewed  Medication reviewed. See [facility]   Medication List       This list is accurate as of: 03/03/16  4:18 PM.  Always use your most recent med list.               acetaminophen 500 MG tablet  Commonly known as:  TYLENOL  Take 500 mg by mouth 2 (two) times daily.     BIOFREEZE 4 % Gel  Generic drug:  Menthol (Topical Analgesic)  Apply 1 application topically every 6 (six) hours. Apply to painful areas/ joints     calcitonin (salmon) 200 UNIT/ACT nasal spray  Commonly known as:  MIACALCIN/FORTICAL  Place 1 spray into alternate nostrils daily.     CALCIUM PO  Take 500 mg by mouth 2 (two) times daily.     cholecalciferol 1000 units tablet  Commonly known as:  VITAMIN D  Take 1,000 Units by mouth daily.     Cranberry 200 MG Caps  Take 400 mg by mouth 2 (two) times daily.     DULoxetine 20 MG capsule  Commonly known as:  CYMBALTA  Take 20 mg by mouth daily. for depression.     ferrous sulfate 220 (44 Fe) MG/5ML solution  Take 330 mg by mouth 2 (two) times daily with a meal. 7.5 mls     folic acid 1 MG tablet  Commonly known as:  FOLVITE  Take 1 mg by mouth daily.     lidocaine 5 %  Commonly known as:  LIDODERM  Place 1 patch onto the skin daily. Apply to lower back once daily. Rotate site daily. Remove & Discard patch within 12 hours (nightly) or as directed by MD     methotrexate 2.5 MG tablet  Commonly known as:  RHEUMATREX  Take 12.5 mg by mouth every Monday. Take five tablets by mouth every week for rheumatoid arthritis     mirtazapine 15 MG tablet  Commonly known as:  REMERON  Take 7.5 mg by mouth at bedtime. for appetite stimulant     NUTRITIONAL SUPPLEMENT PO  Take 120 mLs by mouth daily. Medpass - due to significant weight loss     NUTRITIONAL SUPPLEMENT PO  Take 1 each by mouth 2 (two) times daily before lunch and supper. Magic Cup     polyethylene glycol packet  Commonly known as:  MIRALAX / GLYCOLAX  Take 17 g by mouth every other day. Mix in 8 oz of water or juice and drink     ranitidine 150 MG tablet  Commonly  known as:  ZANTAC  Take 150 mg by mouth 2 (two) times daily.     sennosides-docusate sodium 8.6-50 MG tablet  Commonly known as:  SENOKOT-S  Take 2 tablets by mouth daily.     traMADol 50 MG tablet  Commonly known as:  ULTRAM  Take 25 mg by mouth every 12 (twelve) hours as needed (pain). Take 1/2 tablet by mouth every 12 hours as needed for pain     triamcinolone ointment 0.1 %  Commonly known as:  KENALOG  Apply 1 application topically See admin instructions. Apply to back twice daily until rash is healed     vitamin C 500 MG tablet  Commonly known as:  ASCORBIC ACID  Take 500 mg by mouth daily.        Physical exam BP 104/53 mmHg  Pulse 71  Temp(Src) 97.8 F (36.6 C) (Oral)  Resp 18  Ht 5\' 5"  (1.651 m)  Wt 96 lb 12.8 oz (43.908 kg)  BMI 16.11 kg/m2  SpO2 95%  Wt Readings from Last 3 Encounters:  03/03/16 96 lb 12.8 oz (43.908 kg)  01/27/16 96 lb 12.8 oz (43.908 kg)  01/01/16 104 lb (47.174 kg)   Constitutional: thin and frail elderly female with muscle wasting, in no acute distress. HEENT:  Normocephalic and atraumatic. No icterus or pallor Neck: No lymphadenopathy Cardiac: Normal S1, S2. RRR, systolic murmur. No edema.   Lungs: poor air entry, no wheeze, rhonchi or crackles Abdomen: bowel sounds present, soft, nontender, nondistended. Musculoskeletal: able to move all extremities with limited ROM, on wheelchair, no contractures, distal pulses palpable, hair loss in LE, varicose veins present Skin: Warm and dry. No rash noted. No erythema.   Neurological: pleasantly confused  Labs CBC Latest Ref Rng 02/03/2016 01/31/2016 01/09/2016  WBC - 6.6 11.7(H) 5.4  Hemoglobin 12.0 - 16.0 g/dL 3.9(A) 12.6 10.8(A)  Hematocrit 36 - 46 % 37 38.9 34(A)  Platelets 150 - 399 K/L 286 299 271   CMP Latest Ref Rng 01/31/2016 09/29/2015 09/22/2015  Glucose 65 - 99 mg/dL 11/22/2015) - 94  BUN 6 - 20 mg/dL 12 13 10   Creatinine 0.44 - 1.00 mg/dL 408(X 0.8  Sodium 4.48 - 145 mmol/L 135 135(A) 137  Potassium 3.5 - 5.1 mmol/L 3.7 4.3 3.7  Chloride 101 - 111 mmol/L 99(L) - 101  CO2 22 - 32 mmol/L 21(L) - 30  Calcium 8.9 - 10.3 mg/dL 9.2 - 8.3(L)  Total Protein 6.5 - 8.1 g/dL 7.8 - -  Total Bilirubin 0.3 - 1.2 mg/dL 0.7 - -  Alkaline Phos 38 - 126 U/L 86 - -  AST 15 - 41 U/L 26 - -  ALT 14 - 54 U/L 11(L) - -   Lipid Panel     Component Value Date/Time   CHOL 169 01/09/2016   TRIG 90 01/09/2016   HDL 42 01/09/2016   LDLCALC 109 01/09/2016    Assessment/plan  Cough With poor air entry, concern for aspiration, check CXR.   Change of mental status Her dementia could have precipitated this. Poor po itnake per caregiver. Check u/a with c/s, CXR and labs in am  Chronic depression Continue cymbalta 20 mg daily and remeron  Recurrent uti Sending urine sample, hold off on antibiotics for now, continue cranberry supplement   01/11/2016, MD  [facility] [phone] (Monday-Friday 8 am - 5 pm) [phone] (afterhours)

## 2016-03-22 LAB — BASIC METABOLIC PANEL
BUN: 9 mg/dL (ref 4–21)
Creatinine: 0.8 mg/dL (ref 0.5–1.1)
GLUCOSE: 86 mg/dL
POTASSIUM: 4.1 mmol/L (ref 3.4–5.3)
SODIUM: 136 mmol/L — AB (ref 137–147)

## 2016-03-22 LAB — CBC AND DIFFERENTIAL
HEMATOCRIT: 39 % (ref 36–46)
Hemoglobin: 12.7 g/dL (ref 12.0–16.0)
Platelets: 331 10*3/uL (ref 150–399)
WBC: 6.5 10^3/mL

## 2016-04-05 ENCOUNTER — Non-Acute Institutional Stay (SKILLED_NURSING_FACILITY): Payer: Medicare Other | Admitting: Internal Medicine

## 2016-04-05 ENCOUNTER — Encounter: Payer: Self-pay | Admitting: Internal Medicine

## 2016-04-05 DIAGNOSIS — K219 Gastro-esophageal reflux disease without esophagitis: Secondary | ICD-10-CM

## 2016-04-05 DIAGNOSIS — E46 Unspecified protein-calorie malnutrition: Secondary | ICD-10-CM

## 2016-04-05 DIAGNOSIS — N390 Urinary tract infection, site not specified: Secondary | ICD-10-CM

## 2016-04-05 DIAGNOSIS — B9620 Unspecified Escherichia coli [E. coli] as the cause of diseases classified elsewhere: Secondary | ICD-10-CM

## 2016-04-05 DIAGNOSIS — D509 Iron deficiency anemia, unspecified: Secondary | ICD-10-CM | POA: Diagnosis not present

## 2016-04-05 NOTE — Progress Notes (Signed)
Patient ID: Kristen [name], female   DOB: 01-08-1935, 81 y.o.   MRN: [id number]       [facility]: [employer]  Code Status: Full Code   Chief Complaint  Patient presents with  . Medical Management of Chronic Issues    Routine Visit    Allergies  Allergen Reactions  . Erythromycin Base     Per MAR  . Macrodantin [Nitrofurantoin] Other (See Comments)    Unknown reaction  . Penicillins     Per MAR    HPI 81 y/o female patient is seen for routine visit. She has been at her baseline. No new concern from patient. Per staff, she just completed antibiotic course for UTI. With her advanced dementia, she does not participate in HPI or ROS. No fall reported. No new skin concern from wound nurse.   Review of Systems  : unable to obtain with her dementia   Past medical history reviewed  Medication reviewed. See [facility]   Medication List       This list is accurate as of: 04/05/16 12:34 PM.  Always use your most recent med list.               acetaminophen 500 MG tablet  Commonly known as:  TYLENOL  Take 500 mg by mouth 2 (two) times daily.     BIOFREEZE 4 % Gel  Generic drug:  Menthol (Topical Analgesic)  Apply 1 application topically every 6 (six) hours. Apply to painful areas/ joints     calcitonin (salmon) 200 UNIT/ACT nasal spray  Commonly known as:  MIACALCIN/FORTICAL  Place 1 spray into alternate nostrils daily.     CALCIUM PO  Take 500 mg by mouth 2 (two) times daily.     cholecalciferol 1000 units tablet  Commonly known as:  VITAMIN D  Take 1,000 Units by mouth daily.     Cranberry 200 MG Caps  Take 400 mg by mouth 2 (two) times daily.     DULoxetine 20 MG capsule  Commonly known as:  CYMBALTA  Take 20 mg by mouth daily. for depression.     ferrous sulfate 220 (44 Fe) MG/5ML solution  Take 330 mg by mouth 2 (two) times daily with a meal. 7.5 mls     folic acid 1 MG tablet  Commonly known as:  FOLVITE  Take 1 mg by mouth daily.     lidocaine 5 %  Commonly known as:  LIDODERM  Place 1 patch onto the skin daily. Apply to lower back once daily. Rotate site daily. Remove & Discard patch within 12 hours (nightly) or as directed by MD     methotrexate 2.5 MG tablet  Commonly known as:  RHEUMATREX  Take 12.5 mg by mouth every Monday. Take five tablets by mouth every week for rheumatoid arthritis     mirtazapine 15 MG tablet  Commonly known as:  REMERON  Take 7.5 mg by mouth at bedtime. for appetite stimulant     NUTRITIONAL SUPPLEMENT PO  Take 120 mLs by mouth daily. Medpass - due to significant weight loss     NUTRITIONAL SUPPLEMENT PO  Take 1 each by mouth 3 (three) times daily. Magic Cup     polyethylene glycol packet  Commonly known as:  MIRALAX / GLYCOLAX  Take 17 g by mouth every other day. Mix in 8 oz of water or juice and drink     ranitidine 150 MG tablet  Commonly known as:  ZANTAC  Take 150 mg by mouth 2 (two) times daily.     saccharomyces boulardii 250 MG capsule  Commonly known as:  FLORASTOR  Take 250 mg by mouth 2 (two) times daily. Stop date 04/11/16     sennosides-docusate sodium 8.6-50 MG tablet  Commonly known as:  SENOKOT-S  Take 2 tablets by mouth daily.     traMADol 50 MG tablet  Commonly known as:  ULTRAM  Take 25 mg by mouth every 12 (twelve) hours as needed (pain). Take 1/2 tablet by mouth every 12 hours as needed for pain     vitamin C 500 MG tablet  Commonly known as:  ASCORBIC ACID  Take 500 mg by mouth daily.        Physical exam BP 110/68 mmHg  Pulse 68  Temp(Src) 96.5 F (35.8 C) (Oral)  Resp 18  Ht 5\' 5"  (1.651 m)  Wt 100 lb 3.2 oz (45.45 kg)  BMI 16.67 kg/m2  SpO2 95%  Wt Readings from Last 3 Encounters:  04/05/16 100 lb 3.2 oz (45.45 kg)  03/03/16 96 lb 12.8 oz (43.908 kg)  01/27/16 96 lb 12.8 oz (43.908 kg)   Constitutional: thin and frail elderly female with muscle wasting, in no acute distress. HEENT: Normocephalic and atraumatic. No icterus or  pallor Neck: No lymphadenopathy Cardiac: Normal S1, S2. RRR, systolic murmur. No edema.   Lungs: poor air entry, no wheeze, rhonchi or crackles Abdomen: bowel sounds present, soft, nontender Musculoskeletal: able to move all extremities with limited ROM, on wheelchair, no contractures, distal pulses palpable, hair loss in LE, varicose veins present Skin: Warm and dry   Neurological: pleasantly confused  Labs CBC Latest Ref Rng 03/22/2016 02/03/2016 01/31/2016  WBC - 6.5 6.6 11.7(H)  Hemoglobin 12.0 - 16.0 g/dL 03/30/2016 3.9(A) 12.6  Hematocrit 36 - 46 % 39 37 38.9  Platelets 150 - 399 K/L 331 286 299   CMP Latest Ref Rng 03/22/2016 01/31/2016 09/29/2015  Glucose 65 - 99 mg/dL - 11/29/2015) -  BUN 4 - 21 mg/dL 9 12 13   Creatinine 0.5 - 1.1 mg/dL 0.8 415(A 0.8  Sodium - 147 mmol/L 136(A) 135 135(A)  Potassium 3.4 - 5.3 mmol/L 4.1 3.7 4.3  Chloride 101 - 111 mmol/L - 99(L) -  CO2 22 - 32 mmol/L - 21(L) -  Calcium 8.9 - 10.3 mg/dL - 9.2 -  Total Protein 6.5 - 8.1 g/dL - 7.8 -  Total Bilirubin 0.3 - 1.2 mg/dL - 0.7 -  Alkaline Phos 38 - 126 U/L - 86 -  AST 15 - 41 U/L - 26 -  ALT 14 - 54 U/L - 11(L) -   Lipid Panel     Component Value Date/Time   CHOL 169 01/09/2016   TRIG 90 01/09/2016   HDL 42 01/09/2016   LDLCALC 109 01/09/2016    Assessment/plan  E.coli uti Completed her course of ceftin. Continue cranberry supplement  Iron deficiency anemia Hb normal and remains stable. Currently on ferrous sulfate bid. Change this to ferrous sulfate once a day and monitor  Protein calorie malnutrition Weight low but stable and has gained some pounds since last visit. Continue remeron and might shakes. Monitor weight. Decline anticipated with her dementia  gerd Stable, continue zantac but attempt dose reduction to 150 mg in am and 75 mg in pm and monitor.    01/11/2016, MD  [facility] [phone] (Monday-Friday 8 am - 5 pm) [phone] (afterhours)

## 2016-04-22 ENCOUNTER — Non-Acute Institutional Stay (SKILLED_NURSING_FACILITY): Payer: Medicare Other | Admitting: Internal Medicine

## 2016-04-22 ENCOUNTER — Encounter: Payer: Self-pay | Admitting: Internal Medicine

## 2016-04-22 DIAGNOSIS — K5909 Other constipation: Secondary | ICD-10-CM

## 2016-04-22 DIAGNOSIS — K5900 Constipation, unspecified: Secondary | ICD-10-CM

## 2016-04-22 DIAGNOSIS — R1310 Dysphagia, unspecified: Secondary | ICD-10-CM | POA: Diagnosis not present

## 2016-04-22 DIAGNOSIS — M545 Low back pain: Secondary | ICD-10-CM

## 2016-04-22 DIAGNOSIS — G8929 Other chronic pain: Secondary | ICD-10-CM | POA: Diagnosis not present

## 2016-04-22 NOTE — Progress Notes (Signed)
Patient ID: Kristen [name], female   DOB: 10-03-1935, 81 y.o.   MRN: [id number]       [facility]: [employer]  Code Status: Full Code   Chief Complaint  Patient presents with  . Medical Management of Chronic Issues    Routine Visit    Allergies  Allergen Reactions  . Erythromycin Base     Per MAR  . Macrodantin [Nitrofurantoin] Other (See Comments)    Unknown reaction  . Penicillins     Per MAR    HPI 81 y/o female patient is seen for routine visit. She has been at her baseline. No new concern from patient. No new concern from staff.  Review of Systems  : unable to obtain with her dementia   Past medical history reviewed  Medication reviewed. See [facility]   Medication List       This list is accurate as of: 04/22/16  2:10 PM.  Always use your most recent med list.               acetaminophen 500 MG tablet  Commonly known as:  TYLENOL  Take 500 mg by mouth 2 (two) times daily.     BIOFREEZE 4 % Gel  Generic drug:  Menthol (Topical Analgesic)  Apply 1 application topically every 6 (six) hours. Apply to painful areas/ joints     calcitonin (salmon) 200 UNIT/ACT nasal spray  Commonly known as:  MIACALCIN/FORTICAL  Place 1 spray into alternate nostrils daily.     CALCIUM PO  Take 500 mg by mouth 2 (two) times daily.     cholecalciferol 1000 units tablet  Commonly known as:  VITAMIN D  Take 1,000 Units by mouth daily.     Cranberry 425 MG Caps  Take 425 mg by mouth 2 (two) times daily.     DULoxetine 20 MG capsule  Commonly known as:  CYMBALTA  Take 20 mg by mouth daily. for depression.     ferrous sulfate 220 (44 Fe) MG/5ML solution  Take 330 mg by mouth daily. 7.5 mls     folic acid 1 MG tablet  Commonly known as:  FOLVITE  Take 1 mg by mouth daily.     lidocaine 5 %  Commonly known as:  LIDODERM  Place 1 patch onto the skin daily. Apply to lower back once daily. Rotate site daily. Remove & Discard patch within 12 hours (nightly) or as  directed by MD     methotrexate 2.5 MG tablet  Commonly known as:  RHEUMATREX  Take 12.5 mg by mouth every Monday. Take five tablets by mouth every week for rheumatoid arthritis     mirtazapine 15 MG tablet  Commonly known as:  REMERON  Take 7.5 mg by mouth at bedtime. for appetite stimulant     NUTRITIONAL SUPPLEMENT PO  Take 120 mLs by mouth daily. Medpass - due to significant weight loss     NUTRITIONAL SUPPLEMENT PO  Take 1 each by mouth 3 (three) times daily. Magic Cup     polyethylene glycol packet  Commonly known as:  MIRALAX / GLYCOLAX  Take 17 g by mouth every other day. Mix in 8 oz of water or juice and drink     ranitidine 150 MG tablet  Commonly known as:  ZANTAC  Take 150 mg by mouth daily.     ranitidine 75 MG tablet  Commonly known as:  ZANTAC  Take 75 mg by mouth every evening.  sennosides-docusate sodium 8.6-50 MG tablet  Commonly known as:  SENOKOT-S  Take 2 tablets by mouth daily.     traMADol 50 MG tablet  Commonly known as:  ULTRAM  Take 25 mg by mouth every 12 (twelve) hours as needed (pain). Take 1/2 tablet by mouth every 12 hours as needed for pain     vitamin C 500 MG tablet  Commonly known as:  ASCORBIC ACID  Take 500 mg by mouth daily.        Physical exam BP 115/71 mmHg  Pulse 80  Temp(Src) 97.6 F (36.4 C) (Oral)  Resp 20  Ht 5\' 5"  (1.651 m)  Wt 98 lb 9.6 oz (44.725 kg)  BMI 16.41 kg/m2  SpO2 98%  Wt Readings from Last 3 Encounters:  04/22/16 98 lb 9.6 oz (44.725 kg)  04/05/16 100 lb 3.2 oz (45.45 kg)  03/03/16 96 lb 12.8 oz (43.908 kg)   Constitutional: thin and frail elderly female with muscle wasting, in no acute distress. HEENT: Normocephalic and atraumatic. No icterus or pallor Neck: No lymphadenopathy Cardiac: Normal S1, S2. RRR, systolic murmur. No edema.   Lungs: poor air entry, no wheeze, rhonchi or crackles Abdomen: bowel sounds present, soft, nontender Musculoskeletal: able to move all extremities with limited  ROM, no contractures, distal pulses palpable, hair loss in LE, varicose veins present Skin: Warm and dry   Neurological: pleasantly confused  Labs CBC Latest Ref Rng 03/22/2016 02/03/2016 01/31/2016  WBC - 6.5 6.6 11.7(H)  Hemoglobin 12.0 - 16.0 g/dL 03/30/2016 3.9(A) 12.6  Hematocrit 36 - 46 % 39 37 38.9  Platelets 150 - 399 K/L 331 286 299   CMP Latest Ref Rng 03/22/2016 01/31/2016 09/29/2015  Glucose 65 - 99 mg/dL - 11/29/2015) -  BUN 4 - 21 mg/dL 9 12 13   Creatinine 0.5 - 1.1 mg/dL 0.8 786(V 0.8  Sodium - 147 mmol/L 136(A) 135 135(A)  Potassium 3.4 - 5.3 mmol/L 4.1 3.7 4.3  Chloride 101 - 111 mmol/L - 99(L) -  CO2 22 - 32 mmol/L - 21(L) -  Calcium 8.9 - 10.3 mg/dL - 9.2 -  Total Protein 6.5 - 8.1 g/dL - 7.8 -  Total Bilirubin 0.3 - 1.2 mg/dL - 0.7 -  Alkaline Phos 38 - 126 U/L - 86 -  AST 15 - 41 U/L - 26 -  ALT 14 - 54 U/L - 11(L) -   Lipid Panel     Component Value Date/Time   CHOL 169 01/09/2016   TRIG 90 01/09/2016   HDL 42 01/09/2016   LDLCALC 109 01/09/2016    Assessment/plan  dysphagia Continue mechanical soft diet with aspiration precautions  Low back pain Continue her lidoderm patch, stable, monitor  Constipation Stable, continue miralax daily ad senokot s 2 tab qhs and monitor    01/11/2016, MD  [facility] [phone] (Monday-Friday 8 am - 5 pm) [phone] (afterhours)

## 2016-05-28 LAB — CBC AND DIFFERENTIAL
HCT: 37 % (ref 36–46)
Hemoglobin: 11.8 g/dL — AB (ref 12.0–16.0)
Platelets: 324 10*3/uL (ref 150–399)
WBC: 6.8 10^3/mL

## 2016-06-30 ENCOUNTER — Encounter: Payer: Self-pay | Admitting: Internal Medicine

## 2016-06-30 ENCOUNTER — Non-Acute Institutional Stay (SKILLED_NURSING_FACILITY): Payer: Medicare Other | Admitting: Internal Medicine

## 2016-06-30 DIAGNOSIS — K219 Gastro-esophageal reflux disease without esophagitis: Secondary | ICD-10-CM

## 2016-06-30 DIAGNOSIS — E46 Unspecified protein-calorie malnutrition: Secondary | ICD-10-CM

## 2016-06-30 DIAGNOSIS — F329 Major depressive disorder, single episode, unspecified: Secondary | ICD-10-CM | POA: Diagnosis not present

## 2016-06-30 NOTE — Progress Notes (Signed)
Patient ID: Kristen [name], female   DOB: August 09, 1935, 80 y.o.   MRN: [id number]       [facility]: [employer]  Code Status: Full Code   Chief Complaint  Patient presents with  . Medical Management of Chronic Issues    Routine Visit    Allergies  Allergen Reactions  . Erythromycin Base     Per MAR  . Macrodantin [Nitrofurantoin] Other (See Comments)    Unknown reaction  . Penicillins     Per MAR    HPI 81 y/o female patient is seen for routine visit. She has been at her baseline. No new concern from patient. She had a fall a day back with her found sitting on the floor. No apparent injury reported.   Review of Systems: unable to obtain with her dementia   Past medical history reviewed  Medication reviewed. See [facility]   Medication List       This list is accurate as of: 06/30/16  1:55 PM.  Always use your most recent med list.               acetaminophen 500 MG tablet  Commonly known as:  TYLENOL  Take 500 mg by mouth 2 (two) times daily.     acetaminophen 650 MG suppository  Commonly known as:  TYLENOL  Place 650 mg rectally every 4 (four) hours as needed for mild pain.     BIOFREEZE 4 % Gel  Generic drug:  Menthol (Topical Analgesic)  Apply 1 application topically every 6 (six) hours. Apply to painful areas/ joints     calcitonin (salmon) 200 UNIT/ACT nasal spray  Commonly known as:  MIACALCIN/FORTICAL  Place 1 spray into alternate nostrils daily.     CALCIUM PO  Take 500 mg by mouth 2 (two) times daily.     cholecalciferol 1000 units tablet  Commonly known as:  VITAMIN D  Take 1,000 Units by mouth daily.     Cranberry 425 MG Caps  Take 425 mg by mouth 2 (two) times daily.     DULoxetine 20 MG capsule  Commonly known as:  CYMBALTA  Take 20 mg by mouth daily. for depression.     ferrous sulfate 220 (44 Fe) MG/5ML solution  Take 330 mg by mouth daily. 7.5 mls     folic acid 1 MG tablet  Commonly known as:  FOLVITE  Take 1 mg by mouth  daily.     lidocaine 5 %  Commonly known as:  LIDODERM  Place 1 patch onto the skin daily. Apply to lower back once daily. Rotate site daily. Remove & Discard patch within 12 hours (nightly) or as directed by MD     methotrexate 2.5 MG tablet  Commonly known as:  RHEUMATREX  Take 12.5 mg by mouth every Monday. Take five tablets by mouth every week for rheumatoid arthritis     mirtazapine 15 MG tablet  Commonly known as:  REMERON  Take 7.5 mg by mouth at bedtime. for appetite stimulant     NUTRITIONAL SUPPLEMENT PO  Take 120 mLs by mouth daily. Medpass - due to significant weight loss     NUTRITIONAL SUPPLEMENT PO  Take 1 each by mouth 3 (three) times daily. Mighty shake     polyethylene glycol packet  Commonly known as:  MIRALAX / GLYCOLAX  Take 17 g by mouth daily. Mix in 8 oz of water or juice and drink     ranitidine 150 MG tablet  Commonly known as:  ZANTAC  Take 150 mg by mouth daily.     ranitidine 75 MG tablet  Commonly known as:  ZANTAC  Take 75 mg by mouth every evening.     saccharomyces boulardii 250 MG capsule  Commonly known as:  FLORASTOR  Take 250 mg by mouth 2 (two) times daily. Stop date 07/12/16     sennosides-docusate sodium 8.6-50 MG tablet  Commonly known as:  SENOKOT-S  Take 2 tablets by mouth daily.     traMADol 50 MG tablet  Commonly known as:  ULTRAM  Take 25 mg by mouth every 12 (twelve) hours as needed (pain). Take 1/2 tablet by mouth every 12 hours as needed for pain     vitamin C 500 MG tablet  Commonly known as:  ASCORBIC ACID  Take 500 mg by mouth daily.        Physical exam BP 112/70 mmHg  Pulse 68  Temp(Src) 97.6 F (36.4 C) (Oral)  Resp 18  Ht 5\' 5"  (1.651 m)  Wt 100 lb 3.2 oz (45.45 kg)  BMI 16.67 kg/m2  SpO2 95%  Wt Readings from Last 3 Encounters:  06/30/16 100 lb 3.2 oz (45.45 kg)  04/22/16 98 lb 9.6 oz (44.725 kg)  04/05/16 100 lb 3.2 oz (45.45 kg)   Constitutional: thin and frail elderly female with muscle  wasting, in no acute distress. HEENT: Normocephalic and atraumatic. No icterus or pallor Neck: No lymphadenopathy Cardiac: Normal S1, S2. RRR, systolic murmur. No edema.   Lungs: poor air entry, no wheeze, rhonchi or crackles Abdomen: bowel sounds present, soft, nontender Musculoskeletal: able to move all extremities with limited ROM, no contractures, distal pulses palpable, hair loss in LE, varicose veins present Skin: Warm and dry   Neurological: pleasantly confused  Labs CBC Latest Ref Rng 05/28/2016 03/22/2016 02/03/2016  WBC - 6.8 6.5 6.6  Hemoglobin 12.0 - 16.0 g/dL 11.8(A) 12.7 3.9(A)  Hematocrit 36 - 46 % 37 39 37  Platelets 150 - 399 K/L 324 331 286   CMP Latest Ref Rng 03/22/2016 01/31/2016 09/29/2015  Glucose 65 - 99 mg/dL - 11/29/2015) -  BUN 4 - 21 mg/dL 9 12 13   Creatinine 0.5 - 1.1 mg/dL 0.8 188(C 0.8  Sodium - 147 mmol/L 136(A) 135 135(A)  Potassium 3.4 - 5.3 mmol/L 4.1 3.7 4.3  Chloride 101 - 111 mmol/L - 99(L) -  CO2 22 - 32 mmol/L - 21(L) -  Calcium 8.9 - 10.3 mg/dL - 9.2 -  Total Protein 6.5 - 8.1 g/dL - 7.8 -  Total Bilirubin 0.3 - 1.2 mg/dL - 0.7 -  Alkaline Phos 38 - 126 U/L - 86 -  AST 15 - 41 U/L - 26 -  ALT 14 - 54 U/L - 11(L) -   Lipid Panel     Component Value Date/Time   CHOL 169 01/09/2016   TRIG 90 01/09/2016   HDL 42 01/09/2016   LDLCALC 109 01/09/2016    Assessment/plan  Chronic depression Continue cymbalta 20 mg daily and remeron 7.5 mg daily  gerd Stable, continue ranitidine for now  Protein calorie malnutrition Body mass index is 16.67 kg/(m^2).  Continue remeron for now with nutritional supplement   01/11/2016, MD  [facility] Adult [facility] [phone] (Monday-Friday 8 am - 5 pm) [phone] (afterhours)

## 2016-07-06 DIAGNOSIS — M25521 Pain in right elbow: Secondary | ICD-10-CM | POA: Diagnosis not present

## 2016-07-21 ENCOUNTER — Other Ambulatory Visit: Payer: Self-pay | Admitting: Orthopedic Surgery

## 2016-07-21 ENCOUNTER — Ambulatory Visit
Admission: RE | Admit: 2016-07-21 | Discharge: 2016-07-21 | Disposition: A | Discharge status: Home / Self Care | Payer: Medicare Other | Source: Ambulatory Visit | Attending: Orthopedic Surgery | Admitting: Orthopedic Surgery

## 2016-07-21 DIAGNOSIS — S32511A Fracture of superior rim of right pubis, initial encounter for closed fracture: Secondary | ICD-10-CM

## 2016-07-29 ENCOUNTER — Encounter: Payer: Self-pay | Admitting: Internal Medicine

## 2016-07-29 ENCOUNTER — Non-Acute Institutional Stay (SKILLED_NURSING_FACILITY): Payer: Medicare Other | Admitting: Internal Medicine

## 2016-07-29 DIAGNOSIS — E440 Moderate protein-calorie malnutrition: Secondary | ICD-10-CM

## 2016-07-29 DIAGNOSIS — G4710 Hypersomnia, unspecified: Secondary | ICD-10-CM | POA: Diagnosis not present

## 2016-07-29 DIAGNOSIS — R630 Anorexia: Secondary | ICD-10-CM | POA: Diagnosis not present

## 2016-07-29 DIAGNOSIS — S42401D Unspecified fracture of lower end of right humerus, subsequent encounter for fracture with routine healing: Secondary | ICD-10-CM | POA: Diagnosis not present

## 2016-07-29 DIAGNOSIS — S42401A Unspecified fracture of lower end of right humerus, initial encounter for closed fracture: Secondary | ICD-10-CM | POA: Insufficient documentation

## 2016-07-29 DIAGNOSIS — R400 Somnolence: Secondary | ICD-10-CM

## 2016-07-29 NOTE — Progress Notes (Signed)
Patient ID: Kristen [name], female   DOB: February 15, 1935, 81 y.o.   MRN: [id number]       [facility]: [employer]  Code Status: Full Code   Chief Complaint  Patient presents with  . Medical Management of Chronic Issues    Routine Visit    Allergies  Allergen Reactions  . Erythromycin Base     Per MAR  . Macrodantin [Nitrofurantoin] Other (See Comments)    Unknown reaction  . Penicillins     Per MAR    HPI 81 y/o female patient is seen for routine visit. She refuses her medication and feeding at times. She is OOB daily with one person assist. She has been sleeping more during daytime per nursing. She does not participate in HPI and ROS with her dementia. She has a new right intra-articular olecranon fracture and at present has splint in place with pain medications.   Review of Systems: unable to obtain with her dementia   Past medical history reviewed   Medication reviewed. See [facility]   Medication List       Accurate as of 07/29/16  3:25 PM. Always use your most recent med list.          acetaminophen 500 MG tablet Commonly known as:  TYLENOL Take 500 mg by mouth 2 (two) times daily.   acetaminophen 650 MG suppository Commonly known as:  TYLENOL Place 650 mg rectally every 4 (four) hours as needed for mild pain.   calcitonin (salmon) 200 UNIT/ACT nasal spray Commonly known as:  MIACALCIN/FORTICAL Place 1 spray into alternate nostrils daily.   CALCIUM PO Take 500 mg by mouth 2 (two) times daily.   cholecalciferol 1000 units tablet Commonly known as:  VITAMIN D Take 1,000 Units by mouth daily.   Cranberry 425 MG Caps Take 425 mg by mouth 2 (two) times daily.   diclofenac sodium 1 % Gel Commonly known as:  VOLTAREN Apply 2 g topically 2 (two) times daily. Apply to right hip   DULoxetine 20 MG capsule Commonly known as:  CYMBALTA Take 40 mg by mouth daily. for depression.   ferrous sulfate 220 (44 Fe) MG/5ML solution Take 330 mg by mouth daily.  7.5 mls   folic acid 1 MG tablet Commonly known as:  FOLVITE Take 1 mg by mouth daily.   lidocaine 5 % Commonly known as:  LIDODERM Place 1 patch onto the skin daily. Apply to lower back once daily. Rotate site daily. Remove & Discard patch within 12 hours (nightly) or as directed by MD   methotrexate 2.5 MG tablet Commonly known as:  RHEUMATREX Take 12.5 mg by mouth every Monday. Take five tablets by mouth every week for rheumatoid arthritis   mirtazapine 15 MG tablet Commonly known as:  REMERON Take 7.5 mg by mouth at bedtime. for appetite stimulant   NUTRITIONAL SUPPLEMENT PO Take 120 mLs by mouth daily. Medpass - due to significant weight loss   NUTRITIONAL SUPPLEMENT PO Take 1 each by mouth 3 (three) times daily. Mighty shake   polyethylene glycol packet Commonly known as:  MIRALAX / GLYCOLAX Take 17 g by mouth daily. Mix in 8 oz of water or juice and drink   ranitidine 150 MG tablet Commonly known as:  ZANTAC Take 150 mg by mouth daily.   ranitidine 75 MG tablet Commonly known as:  ZANTAC Take 75 mg by mouth every evening.   sennosides-docusate sodium 8.6-50 MG tablet Commonly known as:  SENOKOT-S Take 2  tablets by mouth daily.   traMADol 50 MG tablet Commonly known as:  ULTRAM Take 50 mg by mouth every 8 (eight) hours as needed (pain).   vitamin C 500 MG tablet Commonly known as:  ASCORBIC ACID Take 500 mg by mouth daily.       Physical exam BP 111/75   Pulse (!) 102   Temp 97.8 F (36.6 C) (Oral)   Resp 16   Ht 5\' 5"  (1.651 m)   Wt 96 lb 9.6 oz (43.8 kg)   SpO2 94%   BMI 16.08 kg/m   Wt Readings from Last 3 Encounters:  07/29/16 96 lb 9.6 oz (43.8 kg)  06/30/16 100 lb 3.2 oz (45.5 kg)  04/22/16 98 lb 9.6 oz (44.7 kg)   Constitutional: thin and frail elderly female with muscle wasting, in no acute distress. HEENT: Normocephalic and atraumatic. No pallor or icterus Neck: No lymphadenopathy Cardiac: Normal S1, S2. RRR, systolic murmur  present. No leg edema.   Lungs: poor air entry, no wheeze, rhonchi or crackles Abdomen: bowel sounds present, soft, nontender Musculoskeletal: able to move all extremities with limited ROM, right arm in splint, no contractures, distal pulses palpable Skin: Warm and dry   Neurological: confused and does not participate in HPI and ROS   Labs CBC Latest Ref Rng & Units 05/28/2016 03/22/2016 02/03/2016  WBC 10:3/mL 6.8 6.5 6.6  Hemoglobin 12.0 - 16.0 g/dL 11.8(A) 12.7 3.9(A)  Hematocrit 36 - 46 % 37 39 37  Platelets 150 - 399 K/L 324 331 286   CMP Latest Ref Rng & Units 03/22/2016 01/31/2016 09/29/2015  Glucose 65 - 99 mg/dL - 11/29/2015) -  BUN 4 - 21 mg/dL 9 12 13   Creatinine 0.5 - 1.1 mg/dL 0.8 568(L 0.8  Sodium - 147 mmol/L 136(A) 135 135(A)  Potassium 3.4 - 5.3 mmol/L 4.1 3.7 4.3  Chloride 101 - 111 mmol/L - 99(L) -  CO2 22 - 32 mmol/L - 21(L) -  Calcium 8.9 - 10.3 mg/dL - 9.2 -  Total Protein 6.5 - 8.1 g/dL - 7.8 -  Total Bilirubin 0.3 - 1.2 mg/dL - 0.7 -  Alkaline Phos 38 - 126 U/L - 86 -  AST 15 - 41 U/L - 26 -  ALT 14 - 54 U/L - 11(L) -   Lipid Panel     Component Value Date/Time   CHOL 169 01/09/2016   TRIG 90 01/09/2016   HDL 42 01/09/2016   LDLCALC 109 01/09/2016   Imaging  Right Elbow X-Ray 2 Views 07/17/16 Impression. Intra-articular olecranon fracture.  Right Hip X-ray unilateral 2-3 Views with Pelvis 07/09/16 Impression. Negative right hip and pelvis. S/P ORIF of old healed left hip fracture. No change since 07-06-16  Left Hip X-Ray unilateral 2-3 Views with Pelvis 07/06/16 Impression. 1. No radiographic evidence of acute fracture or dislocation. If symptoms persist, consider follow-up radiographs in order to evaluate for initial radio graphically occult fracture. 2. Status post left ORIF with intramedullary rod and screw fixation of a healed intertrochanteric fracture with near anatomic alignment. 3. Mild osteoporosis demonstrated. 4. Mild degenerative arthritis.      Assessment/plan  Increased daytime sleepiness Likely iatrogenic with her pain medications. Change her tramadol to 50 mg bid for now and tylenol to 500 mg qid and monitor  Right elbow fracture Has right intra-articular olecranon fracture. Has splint to RUE at all times and has f/u with orthopedics. Currently on tramadol 50 mg q8h with tylenol 500 mg bid. She appears somnolent  and her pain meds could be contributing some. Make changes to pain meds as above. Continue lidocaine patch.  Protein calorie malnutrition With low BMI, decline anticipated with her deconditioning and advanced dementia. RD to follow and continue her nutritional supplement  Poor appetite On remeron 7.5 mg daily, change this to 15 mg daily and monitor, full assistance with feeding   [name], MD Internal Medicine [facility] [address] [place], [place] [address] Cell Phone (Monday-Friday 8 am - 5 pm): [phone] On Call: [phone] and follow prompts after 5 pm and on weekends Office Phone: [phone] Office Fax: [phone]

## 2016-08-15 IMAGING — CT CT HEAD W/O CM
1 of 6 series · 5 of 47 positions shown, 7 images · non-contrast
Comparison: CT cervical spine July 29, 2012; head CT Mamd

CLINICAL DATA: Seizure with questionable fall

EXAM:
CT HEAD WITHOUT CONTRAST
CT CERVICAL SPINE WITHOUT CONTRAST
TECHNIQUE: Multidetector CT imaging of the head and cervical spine was
performed following the standard protocol without intravenous
contrast. Multiplanar CT image reconstructions of the cervical spine
were also generated.

[Series 309: orth · axial · 0.40mm/px · z∈[+595,+655]mm · 5 of 54 slices shown, 7 images]
[im 9/54  brain]
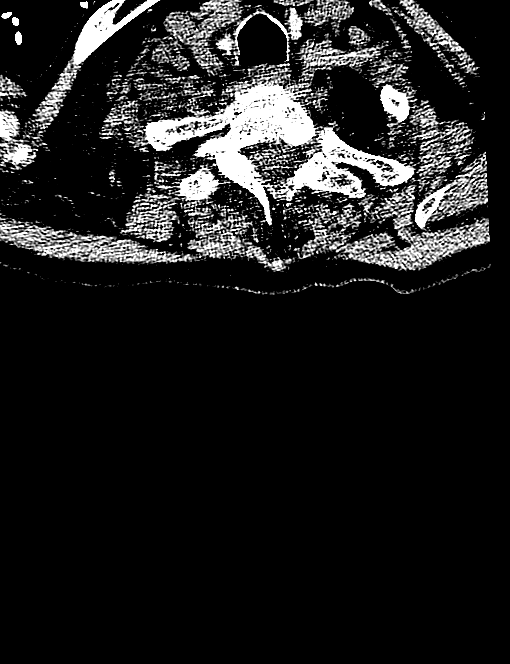
[im 9/54  bone]
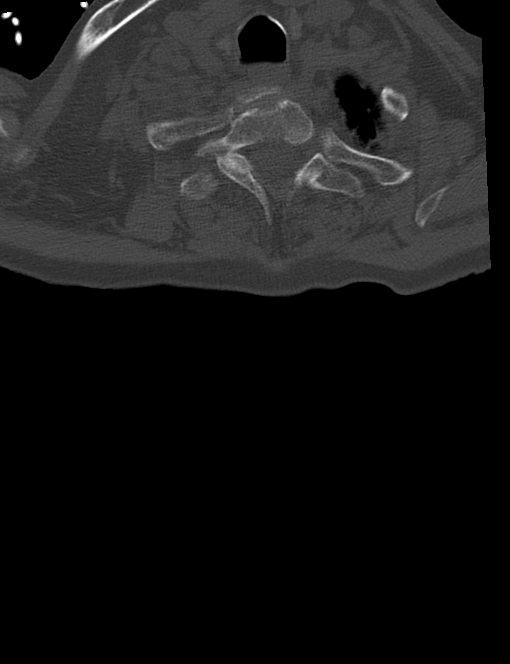
[im 18/54  brain]
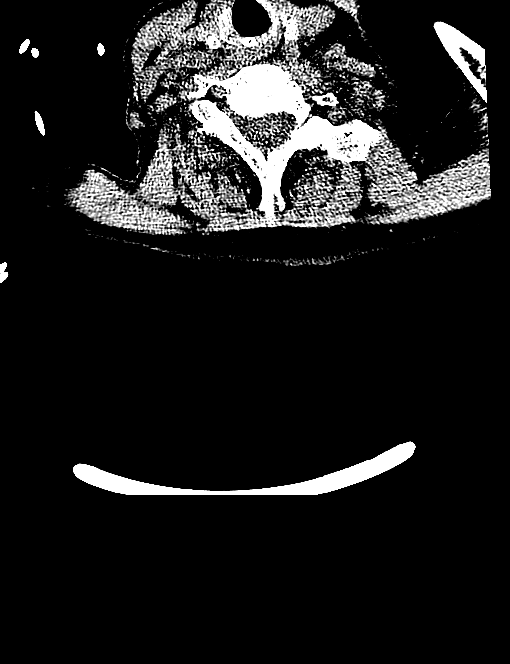
[im 27/54  brain]
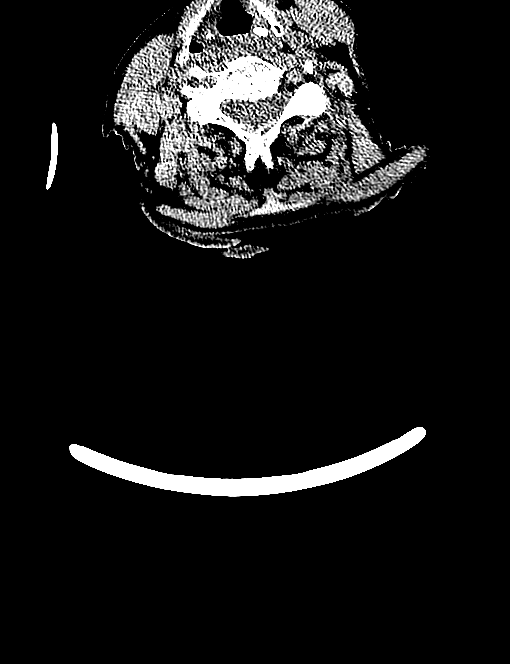
[im 36/54  brain]
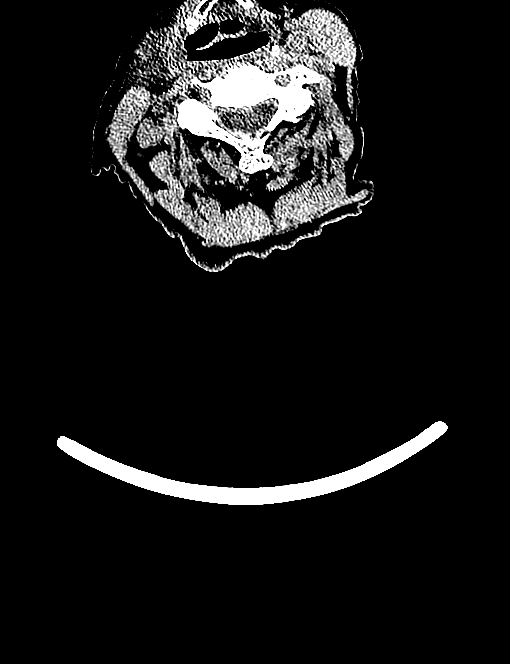
[im 45/54  brain]
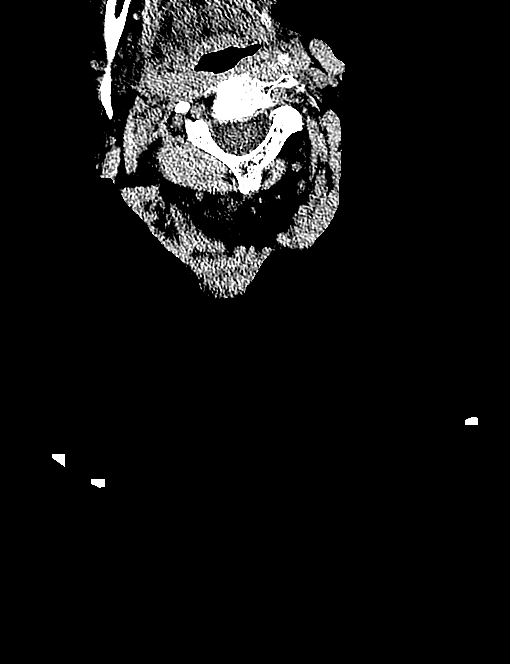
[im 45/54  bone]
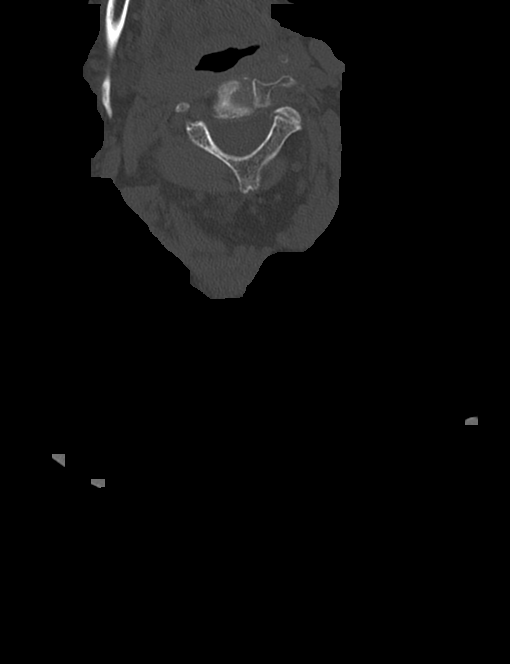

[5 of 47 positions shown; findings below may reference images not displayed]

FINDINGS: CT HEAD FINDINGS

Moderate diffuse atrophy is stable. There is no intracranial mass
hemorrhage, extra-axial fluid collection, or midline shift. There is
patchy small vessel disease throughout the centra semiovale
bilaterally, stable. No new gray-white compartment lesions are
identified. There is no acute appearing infarct.

The bony calvarium appears intact. The mastoid air cells are clear.
There is extensive sphenoid sinus disease bilaterally, more
pronounced on the left than on the right, stable. There are foci of
calcification in the left sphenoid sinus region. No intraorbital
lesions are apparent.

CT CERVICAL SPINE FINDINGS

There is no fracture. There is minimal retrolisthesis of C4 on C5,
likely due to underlying spondylosis. No other spondylolisthesis is
appreciable. Prevertebral soft tissues and predental space regions
are normal. There is moderate disc space narrowing at C3-4, C4-5,
and C6-7. There is facet hypertrophy at multiple levels bilaterally.
There is no disc extrusion or stenosis. There are scattered foci of
carotid artery calcification bilaterally. There is scarring in each
lung apex.
IMPRESSION: CT head: Stable atrophy with extensive periventricular small vessel
disease. No intracranial mass, hemorrhage, or extra-axial fluid
collection. No acute appearing infarct. Chronic sphenoid sinus
disease bilaterally, more severe on the left than on the right.
Calcification in the left sphenoid sinus potentially may represent
chronic fungal colonization.

CT cervical spine: Osteoarthritic changes several levels. Minimal
spondylolisthesis at C4-5 is felt to be due to underlying
spondylosis. No other spondylolisthesis. No fracture evident. Foci
of carotid artery calcification noted bilaterally.

## 2016-09-01 ENCOUNTER — Non-Acute Institutional Stay (SKILLED_NURSING_FACILITY): Payer: Medicare Other | Admitting: Internal Medicine

## 2016-09-01 ENCOUNTER — Encounter: Payer: Self-pay | Admitting: Internal Medicine

## 2016-09-01 DIAGNOSIS — K219 Gastro-esophageal reflux disease without esophagitis: Secondary | ICD-10-CM

## 2016-09-01 DIAGNOSIS — F0390 Unspecified dementia without behavioral disturbance: Secondary | ICD-10-CM | POA: Diagnosis not present

## 2016-09-01 DIAGNOSIS — F03C0 Unspecified dementia, severe, without behavioral disturbance, psychotic disturbance, mood disturbance, and anxiety: Secondary | ICD-10-CM

## 2016-09-01 DIAGNOSIS — E43 Unspecified severe protein-calorie malnutrition: Secondary | ICD-10-CM | POA: Diagnosis not present

## 2016-09-01 NOTE — Progress Notes (Signed)
Patient ID: Kristen [name], female   DOB: 1935/09/11, 81 y.o.   MRN: [id number]       [facility]: [employer]  Code Status: Full Code   Chief Complaint  Patient presents with  . Medical Management of Chronic Issues    Routine Visit    Allergies  Allergen Reactions  . Erythromycin Base     Per MAR  . Macrodantin [Nitrofurantoin] Other (See Comments)    Unknown reaction  . Penicillins     Per MAR    HPI 81 y/o female patient is seen for routine visit. Her medpass has been increased given her poor po intake and weight loss. She has ongoing slow decline with her advanced dementia. She refuses her medication and feeding at times. She does not participate in HPI and ROS with her dementia.   Review of Systems: unable to obtain with her dementia   Past medical history reviewed   Medication reviewed. See [facility]   Medication List       Accurate as of 09/01/16  3:17 PM. Always use your most recent med list.          acetaminophen 500 MG tablet Commonly known as:  TYLENOL Take 500 mg by mouth 4 (four) times daily.   acetaminophen 650 MG suppository Commonly known as:  TYLENOL Place 650 mg rectally every 4 (four) hours as needed for mild pain.   calcitonin (salmon) 200 UNIT/ACT nasal spray Commonly known as:  MIACALCIN/FORTICAL Place 1 spray into alternate nostrils daily.   CALCIUM PO Take 500 mg by mouth 2 (two) times daily.   cholecalciferol 1000 units tablet Commonly known as:  VITAMIN D Take 1,000 Units by mouth daily.   Cranberry 425 MG Caps Take 425 mg by mouth 2 (two) times daily.   diclofenac sodium 1 % Gel Commonly known as:  VOLTAREN Apply 2 g topically 2 (two) times daily. Apply to right hip   DULoxetine 20 MG capsule Commonly known as:  CYMBALTA Take 40 mg by mouth daily. for depression.   ferrous sulfate 220 (44 Fe) MG/5ML solution Take 330 mg by mouth daily. 7.5 mls   folic acid 1 MG tablet Commonly known as:  FOLVITE Take 1 mg by  mouth daily.   lidocaine 5 % Commonly known as:  LIDODERM Place 1 patch onto the skin daily. Apply to lower back once daily. Rotate site daily. Remove & Discard patch within 12 hours (nightly) or as directed by MD   methotrexate 2.5 MG tablet Commonly known as:  RHEUMATREX Take 12.5 mg by mouth every Monday. Take five tablets by mouth every week for rheumatoid arthritis   mirtazapine 15 MG tablet Commonly known as:  REMERON Take 15 mg by mouth at bedtime. for appetite stimulant   NUTRITIONAL SUPPLEMENT PO Take 120 mLs by mouth daily. Medpass - due to significant weight loss   NUTRITIONAL SUPPLEMENT PO Take 1 each by mouth 3 (three) times daily. Mighty shake   polyethylene glycol packet Commonly known as:  MIRALAX / GLYCOLAX Take 17 g by mouth daily. Mix in 8 oz of water or juice and drink   ranitidine 150 MG tablet Commonly known as:  ZANTAC Take 150 mg by mouth daily.   ranitidine 75 MG tablet Commonly known as:  ZANTAC Take 75 mg by mouth every evening.   sennosides-docusate sodium 8.6-50 MG tablet Commonly known as:  SENOKOT-S Take 2 tablets by mouth daily.   traMADol 50 MG tablet Commonly known as:  ULTRAM Take 50 mg by mouth 2 (two) times daily. Every 8 hours as needed for severe pain only   UNABLE TO FIND Med Name: Med pass 120 mL 3 times daily   vitamin C 500 MG tablet Commonly known as:  ASCORBIC ACID Take 500 mg by mouth daily.       Physical exam BP 122/64   Pulse 74   Temp 97.9 F (36.6 C) (Oral)   Resp 18   Ht 5\' 5"  (1.651 m)   Wt 90 lb 6.4 oz (41 kg)   SpO2 95%   BMI 15.04 kg/m   Wt Readings from Last 3 Encounters:  09/01/16 90 lb 6.4 oz (41 kg)  07/29/16 96 lb 9.6 oz (43.8 kg)  06/30/16 100 lb 3.2 oz (45.5 kg)   Constitutional: thin and frail elderly female with muscle wasting, in no acute distress. HEENT: Normocephalic and atraumatic. No pallor or icterus Neck: No lymphadenopathy Cardiac: Normal S1, S2. RRR, systolic murmur present.  No leg edema.   Lungs: poor air entry, no wheeze, rhonchi or crackles Abdomen: bowel sounds present, soft, nontender Musculoskeletal: able to move all extremities with limited ROM, right arm in splint, no contractures, distal pulses palpable Skin: Warm and dry   Neurological: pleasantly confused and does not participate in HPI and ROS   Labs CBC Latest Ref Rng & Units 05/28/2016 03/22/2016 02/03/2016  WBC 10:3/mL 6.8 6.5 6.6  Hemoglobin 12.0 - 16.0 g/dL 11.8(A) 12.7 3.9(A)  Hematocrit 36 - 46 % 37 39 37  Platelets 150 - 399 K/L 324 331 286   CMP Latest Ref Rng & Units 03/22/2016 01/31/2016 09/29/2015  Glucose 65 - 99 mg/dL - 11/29/2015) -  BUN 4 - 21 mg/dL 9 12 13   Creatinine 0.5 - 1.1 mg/dL 0.8 163(A 0.8  Sodium - 147 mmol/L 136(A) 135 135(A)  Potassium 3.4 - 5.3 mmol/L 4.1 3.7 4.3  Chloride 101 - 111 mmol/L - 99(L) -  CO2 22 - 32 mmol/L - 21(L) -  Calcium 8.9 - 10.3 mg/dL - 9.2 -  Total Protein 6.5 - 8.1 g/dL - 7.8 -  Total Bilirubin 0.3 - 1.2 mg/dL - 0.7 -  Alkaline Phos 38 - 126 U/L - 86 -  AST 15 - 41 U/L - 26 -  ALT 14 - 54 U/L - 11(L) -   Lipid Panel     Component Value Date/Time   CHOL 169 01/09/2016   TRIG 90 01/09/2016   HDL 42 01/09/2016   LDLCALC 109 01/09/2016   Imaging  Right Elbow X-Ray 2 Views 07/17/16 Impression. Intra-articular olecranon fracture.  Right Hip X-ray unilateral 2-3 Views with Pelvis 07/09/16 Impression. Negative right hip and pelvis. S/P ORIF of old healed left hip fracture. No change since 07-06-16  Left Hip X-Ray unilateral 2-3 Views with Pelvis 07/06/16 Impression. 1. No radiographic evidence of acute fracture or dislocation. If symptoms persist, consider follow-up radiographs in order to evaluate for initial radio graphically occult fracture. 2. Status post left ORIF with intramedullary rod and screw fixation of a healed intertrochanteric fracture with near anatomic alignment. 3. Mild osteoporosis demonstrated. 4. Mild degenerative arthritis.      Assessment/plan  Severe protein calorie malnutrition medpass recently increased to 120 ml tid. Assistance with feed to be provided. Decline anticipated with her deconditioning and advanced dementia. consider palliative care consult if this persists. RD on board  Advanced dementia Poor overall prognosis. Consider palliative care consult if family agrees  gerd without esophagitis Continue ranitidine  [name], MD Internal Medicine [facility] [address] [place], [place] [address] Cell Phone (Monday-Friday 8 am - 5 pm): [phone] On Call: [phone] and follow prompts after 5 pm and on weekends Office Phone: [phone] Office Fax: [phone]

## 2016-09-10 ENCOUNTER — Other Ambulatory Visit: Payer: Self-pay | Admitting: *Deleted

## 2016-09-10 MED ORDER — TRAMADOL HCL 50 MG PO TABS: ORAL_TABLET | ORAL | 0 refills | Status: DC

## 2016-09-10 NOTE — Telephone Encounter (Signed)
[facility] [phone] Fax: [phone]  

## 2016-09-21 LAB — CBC AND DIFFERENTIAL
HCT: 34 % — AB (ref 36–46)
Hemoglobin: 10.7 g/dL — AB (ref 12.0–16.0)
PLATELETS: 373 10*3/uL (ref 150–399)
WBC: 7.4 10*3/mL

## 2016-09-21 LAB — BASIC METABOLIC PANEL
BUN: 15 mg/dL (ref 4–21)
CREATININE: 0.6 mg/dL (ref 0.5–1.1)
GLUCOSE: 83 mg/dL
Potassium: 4 mmol/L (ref 3.4–5.3)
Sodium: 136 mmol/L — AB (ref 137–147)

## 2016-09-29 ENCOUNTER — Non-Acute Institutional Stay (SKILLED_NURSING_FACILITY): Payer: Medicare Other | Admitting: Internal Medicine

## 2016-09-29 ENCOUNTER — Encounter: Payer: Self-pay | Admitting: Internal Medicine

## 2016-09-29 DIAGNOSIS — N3090 Cystitis, unspecified without hematuria: Secondary | ICD-10-CM | POA: Diagnosis not present

## 2016-09-29 DIAGNOSIS — F329 Major depressive disorder, single episode, unspecified: Secondary | ICD-10-CM

## 2016-09-29 DIAGNOSIS — G4719 Other hypersomnia: Secondary | ICD-10-CM | POA: Diagnosis not present

## 2016-09-29 DIAGNOSIS — M159 Polyosteoarthritis, unspecified: Secondary | ICD-10-CM

## 2016-09-29 DIAGNOSIS — Z8781 Personal history of (healed) traumatic fracture: Secondary | ICD-10-CM | POA: Diagnosis not present

## 2016-09-29 NOTE — Progress Notes (Signed)
Patient ID: Kristen [name], female   DOB: 1935-03-08, 81 y.o.   MRN: [id number]       [facility]: [employer]  Code Status: Full Code   Chief Complaint  Patient presents with  . Medical Management of Chronic Issues    Routine Visit    Allergies  Allergen Reactions  . Erythromycin Base     Per MAR  . Macrodantin [Nitrofurantoin] Other (See Comments)    Unknown reaction  . Penicillins     Per MAR    HPI 81 y/o female patient is seen for routine visit. She is pleasantly confused. she has poor oral intake. She does not participate in HPI and ROS with her dementia.   Review of Systems: unable to obtain with her dementia   Past medical history reviewed   Medication reviewed. See [facility]   Medication List       Accurate as of 09/29/16  2:37 PM. Always use your most recent med list.          acetaminophen 500 MG tablet Commonly known as:  TYLENOL Take 500 mg by mouth 4 (four) times daily.   acetaminophen 650 MG suppository Commonly known as:  TYLENOL Place 650 mg rectally every 4 (four) hours as needed for mild pain.   calcitonin (salmon) 200 UNIT/ACT nasal spray Commonly known as:  MIACALCIN/FORTICAL Place 1 spray into alternate nostrils daily.   CALCIUM PO Take 500 mg by mouth 2 (two) times daily.   cholecalciferol 1000 units tablet Commonly known as:  VITAMIN D Take 1,000 Units by mouth daily.   Cranberry 425 MG Caps Take 425 mg by mouth 2 (two) times daily.   diclofenac sodium 1 % Gel Commonly known as:  VOLTAREN Apply 2 g topically 2 (two) times daily. Apply to right hip   DULoxetine 20 MG capsule Commonly known as:  CYMBALTA Take 40 mg by mouth daily. for depression.   ferrous sulfate 220 (44 Fe) MG/5ML solution Take 330 mg by mouth daily. 7.5 mls   folic acid 1 MG tablet Commonly known as:  FOLVITE Take 1 mg by mouth daily.   lidocaine 5 % Commonly known as:  LIDODERM Place 1 patch onto the skin daily. Apply to lower back once  daily. Rotate site daily. Remove & Discard patch within 12 hours (nightly) or as directed by MD   methotrexate 2.5 MG tablet Commonly known as:  RHEUMATREX Take 12.5 mg by mouth every Monday. Take five tablets by mouth every week for rheumatoid arthritis   mirtazapine 15 MG tablet Commonly known as:  REMERON Take 15 mg by mouth at bedtime. for appetite stimulant   NUTRITIONAL SUPPLEMENT PO Take 1 each by mouth 3 (three) times daily. Mighty shake   ondansetron 4 MG tablet Commonly known as:  ZOFRAN Take 4 mg by mouth 3 (three) times daily as needed for nausea or vomiting.   polyethylene glycol packet Commonly known as:  MIRALAX / GLYCOLAX Take 17 g by mouth daily. Mix in 8 oz of water or juice and drink   ranitidine 150 MG tablet Commonly known as:  ZANTAC Take 150 mg by mouth daily.   ranitidine 75 MG tablet Commonly known as:  ZANTAC Take 75 mg by mouth every evening.   sennosides-docusate sodium 8.6-50 MG tablet Commonly known as:  SENOKOT-S Take 2 tablets by mouth daily.   UNABLE TO FIND Med Name: Med pass 120 mL 3 times daily   vitamin C 500 MG tablet Commonly  known as:  ASCORBIC ACID Take 500 mg by mouth daily.       Physical exam BP 110/62   Pulse 75   Temp 97.9 F (36.6 C) (Oral)   Resp 18   Ht 5\' 5"  (1.651 m)   Wt 87 lb 12.8 oz (39.8 kg)   SpO2 97%   BMI 14.61 kg/m   Wt Readings from Last 3 Encounters:  09/29/16 87 lb 12.8 oz (39.8 kg)  09/01/16 90 lb 6.4 oz (41 kg)  07/29/16 96 lb 9.6 oz (43.8 kg)   Constitutional: thin and frail elderly female with muscle wasting, in no acute distress. HEENT: Normocephalic and atraumatic. No pallor or icterus Neck: No lymphadenopathy Cardiac: Normal S1, S2. RRR, systolic murmur present. No leg edema.   Lungs: poor air entry, no wheeze, rhonchi or crackles Abdomen: bowel sounds present, soft, nontender Musculoskeletal: able to move all extremities, severe OA changes Skin: Warm and dry   Neurological:  pleasantly confused and does not participate in HPI and ROS   Labs CBC Latest Ref Rng & Units 09/21/2016 05/28/2016 03/22/2016  WBC 10:3/mL 7.4 6.8 6.5  Hemoglobin 12.0 - 16.0 g/dL 10.7(A) 11.8(A) 12.7  Hematocrit 36 - 46 % 34(A) 37 39  Platelets 150 - 399 K/L 373 324 331   CMP Latest Ref Rng & Units 09/21/2016 03/22/2016 01/31/2016  Glucose 65 - 99 mg/dL - - 03/30/2016)  BUN 4 - 21 mg/dL 15 9 12   Creatinine 0.5 - 1.1 mg/dL 0.6 0.8 370(W  Sodium - 147 mmol/L 136(A) 136(A) 135  Potassium 3.4 - 5.3 mmol/L 4.0 4.1 3.7  Chloride 101 - 111 mmol/L - - 99(L)  CO2 22 - 32 mmol/L - - 21(L)  Calcium 8.9 - 10.3 mg/dL - - 9.2  Total Protein 6.5 - 8.1 g/dL - - 7.8  Total Bilirubin 0.3 - 1.2 mg/dL - - 0.7  Alkaline Phos 38 - 126 U/L - - 86  AST 15 - 41 U/L - - 26  ALT 14 - 54 U/L - - 11(L)   Lipid Panel     Component Value Date/Time   CHOL 169 01/09/2016   TRIG 90 01/09/2016   HDL 42 01/09/2016   LDLCALC 109 01/09/2016   Imaging  Right Elbow X-Ray 2 Views 07/17/16 Impression. Intra-articular olecranon fracture.  Right Hip X-ray unilateral 2-3 Views with Pelvis 07/09/16 Impression. Negative right hip and pelvis. S/P ORIF of old healed left hip fracture. No change since 07-06-16  Left Hip X-Ray unilateral 2-3 Views with Pelvis 07/06/16 Impression. 1. No radiographic evidence of acute fracture or dislocation. If symptoms persist, consider follow-up radiographs in order to evaluate for initial radio graphically occult fracture. 2. Status post left ORIF with intramedullary rod and screw fixation of a healed intertrochanteric fracture with near anatomic alignment. 3. Mild osteoporosis demonstrated. 4. Mild degenerative arthritis.     Assessment/plan  OA And hx of fractures. Continue tylenol 500mg  qid for pain and lidocaine patch  History of fracture Continue calcium with vitamin d supplement and pain med  Recurrent uti Stable, no recent uti, continue cranberry supplement, hydration to be  encouraged  increased daytime sleepiness Her deconditioning, advanced dementia and meds could be contributing. Decrease her duolxetine to 30 mg daily x 1 week and then 20 mg daily and monitor.  Chronic depression Followed by psych services. Continue remeron and cymbalta with changes as above    03-17-1970, MD Internal Medicine [facility] [address]  [place], [place] [address] Cell Phone (Monday-Friday 8 am - 5 pm): [phone] On Call: [phone] and follow prompts after 5 pm and on weekends Office Phone: [phone] Office Fax: [phone]

## 2016-11-22 ENCOUNTER — Non-Acute Institutional Stay (SKILLED_NURSING_FACILITY): Payer: Medicare Other | Admitting: Internal Medicine

## 2016-11-22 ENCOUNTER — Encounter: Payer: Self-pay | Admitting: Internal Medicine

## 2016-11-22 DIAGNOSIS — K219 Gastro-esophageal reflux disease without esophagitis: Secondary | ICD-10-CM | POA: Diagnosis not present

## 2016-11-22 DIAGNOSIS — D509 Iron deficiency anemia, unspecified: Secondary | ICD-10-CM

## 2016-11-22 DIAGNOSIS — K5909 Other constipation: Secondary | ICD-10-CM | POA: Diagnosis not present

## 2016-11-22 NOTE — Progress Notes (Signed)
Patient ID: Kristen [name], female   DOB: [birth year], 81 y.o.   MRN: [id number]       [facility]: [employer]  Code Status: Full Code   Chief Complaint  Patient presents with  . Medical Management of Chronic Issues    Routine Visit    Allergies  Allergen Reactions  . Erythromycin Base     Per MAR  . Macrodantin [Nitrofurantoin] Other (See Comments)    Unknown reaction  . Penicillins     Per MAR    HPI 81 y/o female patient is seen for routine visit. She denies any concern. No concern from nursing this visit. She is pleasantly confused and does not participate in HPI and ROS.   Review of Systems: unable to obtain with her dementia   Past Medical History:  Diagnosis Date  . COPD (chronic obstructive pulmonary disease) (HCC) 05/15/2013  . GERD (gastroesophageal reflux disease) 05/15/2013  . HCAP (healthcare-associated pneumonia) 01/13/2014  . HTN (hypertension) 05/15/2013  . RA (rheumatoid arthritis) (HCC) 05/15/2013  . Unspecified constipation 05/15/2013  . UTI (urinary tract infection) 05/15/2013    Medication reviewed. See [facility]   Medication List       Accurate as of 11/22/16  3:30 PM. Always use your most recent med list.          acetaminophen 500 MG tablet Commonly known as:  TYLENOL Take 500 mg by mouth 4 (four) times daily.   acetaminophen 650 MG suppository Commonly known as:  TYLENOL Place 650 mg rectally every 4 (four) hours as needed for mild pain.   calcitonin (salmon) 200 UNIT/ACT nasal spray Commonly known as:  MIACALCIN/FORTICAL Place 1 spray into alternate nostrils daily.   CALCIUM PO Take 500 mg by mouth 2 (two) times daily.   cholecalciferol 1000 units tablet Commonly known as:  VITAMIN D Take 1,000 Units by mouth daily.   Cranberry 425 MG Caps Take 425 mg by mouth 2 (two) times daily.   diclofenac sodium 1 % Gel Commonly known as:  VOLTAREN Apply 2 g topically 2 (two) times daily. Apply to right hip   DULoxetine 20 MG  capsule Commonly known as:  CYMBALTA Take 20 mg by mouth daily. for depression.   ferrous sulfate 220 (44 Fe) MG/5ML solution Take 330 mg by mouth daily. 7.5 mls   folic acid 1 MG tablet Commonly known as:  FOLVITE Take 1 mg by mouth daily.   ICY HOT LIDOCAINE PLUS MENTHOL 4-1 % Ptch Generic drug:  Lidocaine-Menthol Apply 1 application topically daily.   methotrexate 2.5 MG tablet Commonly known as:  RHEUMATREX Take 12.5 mg by mouth every Monday. Take five tablets by mouth every week for rheumatoid arthritis   mirtazapine 15 MG tablet Commonly known as:  REMERON Take 15 mg by mouth at bedtime. for appetite stimulant   NUTRITIONAL SUPPLEMENT PO Take 1 each by mouth 3 (three) times daily. Mighty shake   ondansetron 4 MG tablet Commonly known as:  ZOFRAN Take 4 mg by mouth 3 (three) times daily as needed for nausea or vomiting.   polyethylene glycol packet Commonly known as:  MIRALAX / GLYCOLAX Take 17 g by mouth daily. Mix in 8 oz of water or juice and drink   ranitidine 150 MG tablet Commonly known as:  ZANTAC Take 150 mg by mouth daily.   ranitidine 75 MG tablet Commonly known as:  ZANTAC Take 75 mg by mouth every evening.   sennosides-docusate sodium 8.6-50 MG tablet Commonly known  as:  SENOKOT-S Take 2 tablets by mouth daily.   traMADol 50 MG tablet Commonly known as:  ULTRAM Take 50 mg by mouth every 8 (eight) hours as needed for severe pain.   UNABLE TO FIND Med Name: Med pass 120 mL 3 times daily   vitamin C 500 MG tablet Commonly known as:  ASCORBIC ACID Take 500 mg by mouth daily.       Physical exam BP 122/64   Pulse 70   Temp 97.3 F (36.3 C) (Oral)   Resp 20   Ht 5' (1.524 m)   Wt 90 lb 6.4 oz (41 kg)   SpO2 97%   BMI 17.66 kg/m   Wt Readings from Last 3 Encounters:  11/22/16 90 lb 6.4 oz (41 kg)  09/29/16 87 lb 12.8 oz (39.8 kg)  09/01/16 90 lb 6.4 oz (41 kg)   Constitutional: elderly female, thin built, frail with muscle  wasting, in no acute distress. HEENT: Normocephalic and atraumatic. No pallor or icterus Neck: No lymphadenopathy Cardiac: Normal S1, S2. RRR, systolic murmur present. No leg edema.   Lungs: poor air entry, no wheeze, rhonchi or crackles Abdomen: bowel sounds present, soft, nontender Musculoskeletal: able to move all extremities, generalized weakness Skin: Warm and dry   Neurological: pleasantly confused   Labs CBC Latest Ref Rng & Units 09/21/2016 05/28/2016 03/22/2016  WBC 10:3/mL 7.4 6.8 6.5  Hemoglobin 12.0 - 16.0 g/dL 10.7(A) 11.8(A) 12.7  Hematocrit 36 - 46 % 34(A) 37 39  Platelets 150 - 399 K/L 373 324 331   CMP Latest Ref Rng & Units 09/21/2016 03/22/2016 01/31/2016  Glucose 65 - 99 mg/dL - - 256(L)  BUN 4 - 21 mg/dL 15 9 12   Creatinine 0.5 - 1.1 mg/dL 0.6 0.8  Sodium 8.93 - 147 mmol/L 136(A) 136(A) 135  Potassium 3.4 - 5.3 mmol/L 4.0 4.1 3.7  Chloride 101 - 111 mmol/L - - 99(L)  CO2 22 - 32 mmol/L - - 21(L)  Calcium 8.9 - 10.3 mg/dL - - 9.2  Total Protein 6.5 - 8.1 g/dL - - 7.8  Total Bilirubin 0.3 - 1.2 mg/dL - - 0.7  Alkaline Phos 38 - 126 U/L - - 86  AST 15 - 41 U/L - - 26  ALT 14 - 54 U/L - - 11(L)   Lipid Panel     Component Value Date/Time   CHOL 169 01/09/2016   TRIG 90 01/09/2016   HDL 42 01/09/2016   LDLCALC 109 01/09/2016    Imaging  Right Elbow X-Ray 2 Views 07/17/16 Impression. Intra-articular olecranon fracture.  Right Hip X-ray unilateral 2-3 Views with Pelvis 07/09/16 Impression. Negative right hip and pelvis. S/P ORIF of old healed left hip fracture. No change since 07-06-16  Left Hip X-Ray unilateral 2-3 Views with Pelvis 07/06/16 Impression. 1. No radiographic evidence of acute fracture or dislocation. If symptoms persist, consider follow-up radiographs in order to evaluate for initial radio graphically occult fracture. 2. Status post left ORIF with intramedullary rod and screw fixation of a healed intertrochanteric fracture with near anatomic  alignment. 3. Mild osteoporosis demonstrated. 4. Mild degenerative arthritis.     Assessment/plan  gerd Stable, on ranitidine 150 mg in am and 75 mg in pm, monitor  Iron def anemia On ferrous sulfate, monitor clinically  Chronic constipation On miralax daily with senokot s, monitor her bowel movement   07/08/16, MD Internal Medicine [facility] [address] [place], [place] Cell  Phone (Monday-Friday 8 am - 5 pm): [phone] On Call: [phone] and follow prompts after 5 pm and on weekends Office Phone: [phone] Office Fax: [phone]

## 2016-12-27 LAB — BASIC METABOLIC PANEL
BUN: 13 mg/dL (ref 4–21)
Creatinine: 0.7 mg/dL (ref 0.5–1.1)
GLUCOSE: 105 mg/dL
POTASSIUM: 4.6 mmol/L (ref 3.4–5.3)
Sodium: 133 mmol/L — AB (ref 137–147)

## 2016-12-27 LAB — CBC AND DIFFERENTIAL
HEMATOCRIT: 35 % — AB (ref 36–46)
HEMOGLOBIN: 11.2 g/dL — AB (ref 12.0–16.0)
Platelets: 312 10*3/uL (ref 150–399)
WBC: 8.1 10^3/mL

## 2016-12-30 ENCOUNTER — Encounter: Payer: Self-pay | Admitting: Internal Medicine

## 2016-12-30 ENCOUNTER — Non-Acute Institutional Stay (SKILLED_NURSING_FACILITY): Payer: Medicare Other | Admitting: Internal Medicine

## 2016-12-30 DIAGNOSIS — N3000 Acute cystitis without hematuria: Secondary | ICD-10-CM | POA: Diagnosis not present

## 2016-12-30 DIAGNOSIS — F0390 Unspecified dementia without behavioral disturbance: Secondary | ICD-10-CM | POA: Diagnosis not present

## 2016-12-30 DIAGNOSIS — R638 Other symptoms and signs concerning food and fluid intake: Secondary | ICD-10-CM

## 2016-12-30 DIAGNOSIS — F03C0 Unspecified dementia, severe, without behavioral disturbance, psychotic disturbance, mood disturbance, and anxiety: Secondary | ICD-10-CM

## 2016-12-30 DIAGNOSIS — E43 Unspecified severe protein-calorie malnutrition: Secondary | ICD-10-CM

## 2016-12-30 NOTE — Progress Notes (Signed)
Patient ID: Kristen [name], female   DOB: [birth year], 82 y.o.   MRN: [id number]       [facility]: [employer]  Code Status: Full Code   Chief Complaint  Patient presents with  . Medical Management of Chronic Issues    Routine Visit     Allergies  Allergen Reactions  . Erythromycin Base     Per MAR  . Macrodantin [Nitrofurantoin] Other (See Comments)    Unknown reaction  . Penicillins     Per MAR    HPI 82 y/o female patient is seen for routine visit. She is currently on antibiotics for urinary tract infection. Per nursing staff, she has pulled out her IV line twice a she has poor oral intake. She ate 5% of her breakfast this morning. She was receiving IV fluids on the eighth and ninth of January. Reviewed her lab results. Chest x-ray was negative for infiltrates. She has advanced dementia and does not participate in history of present illness and review of systems.  Review of Systems: unable to obtain with her dementia   Past Medical History:  Diagnosis Date  . COPD (chronic obstructive pulmonary disease) (HCC) 05/15/2013  . GERD (gastroesophageal reflux disease) 05/15/2013  . HCAP (healthcare-associated pneumonia) 01/13/2014  . HTN (hypertension) 05/15/2013  . RA (rheumatoid arthritis) (HCC) 05/15/2013  . Unspecified constipation 05/15/2013  . UTI (urinary tract infection) 05/15/2013    Medication reviewed. See MAR Allergies as of 12/30/2016      Reactions   Erythromycin Base    Per MAR   Macrodantin [nitrofurantoin] Other (See Comments)   Unknown reaction   Penicillins    Per [facility]      Medication List       Accurate as of 12/30/16  3:39 PM. Always use your most recent med list.          acetaminophen 500 MG tablet Commonly known as:  TYLENOL Take 500 mg by mouth 4 (four) times daily.   acetaminophen 650 MG suppository Commonly known as:  TYLENOL Place 650 mg rectally every 4 (four) hours as needed for mild pain.   calcitonin (salmon) 200  UNIT/ACT nasal spray Commonly known as:  MIACALCIN/FORTICAL Place 1 spray into alternate nostrils daily.   CALCIUM PO Take 500 mg by mouth 2 (two) times daily.   cefTRIAXone IVPB Commonly known as:  ROCEPHIN Inject 1 g into the vein daily.   cholecalciferol 1000 units tablet Commonly known as:  VITAMIN D Take 1,000 Units by mouth daily.   Cranberry 425 MG Caps Take 425 mg by mouth 2 (two) times daily.   diclofenac sodium 1 % Gel Commonly known as:  VOLTAREN Apply 2 g topically 2 (two) times daily. Apply to right hip   DULoxetine 20 MG capsule Commonly known as:  CYMBALTA Take 20 mg by mouth daily. for depression.   ferrous sulfate 220 (44 Fe) MG/5ML solution Take 330 mg by mouth daily. 7.5 mls   folic acid 1 MG tablet Commonly known as:  FOLVITE Take 1 mg by mouth daily.   ICY HOT LIDOCAINE PLUS MENTHOL 4-1 % Ptch Generic drug:  Lidocaine-Menthol Apply 1 application topically daily.   methotrexate 2.5 MG tablet Commonly known as:  RHEUMATREX Take 12.5 mg by mouth every Monday. Take five tablets by mouth every week for rheumatoid arthritis   mirtazapine 15 MG tablet Commonly known as:  REMERON Take 15 mg by mouth at bedtime. for appetite stimulant   NUTRITIONAL SUPPLEMENT PO Take  1 each by mouth 3 (three) times daily. Mighty shake   ondansetron 4 MG tablet Commonly known as:  ZOFRAN Take 4 mg by mouth 3 (three) times daily as needed for nausea or vomiting.   polyethylene glycol packet Commonly known as:  MIRALAX / GLYCOLAX Take 17 g by mouth daily. Mix in 8 oz of water or juice and drink   ranitidine 150 MG tablet Commonly known as:  ZANTAC Take 150 mg by mouth daily.   ranitidine 75 MG tablet Commonly known as:  ZANTAC Take 75 mg by mouth every evening.   sennosides-docusate sodium 8.6-50 MG tablet Commonly known as:  SENOKOT-S Take 2 tablets by mouth daily.   traMADol 50 MG tablet Commonly known as:  ULTRAM Take 50 mg by mouth every 8 (eight)  hours as needed for severe pain.   UNABLE TO FIND Med Name: Med pass 120 mL 3 times daily   vitamin C 500 MG tablet Commonly known as:  ASCORBIC ACID Take 500 mg by mouth daily.       Physical exam BP 132/72   Pulse 88   Temp 98.2 F (36.8 C) (Oral)   Resp 18   Ht 5' (1.524 m)   Wt 93 lb 3.2 oz (42.3 kg)   SpO2 94%   BMI 18.20 kg/m   Wt Readings from Last 3 Encounters:  12/30/16 93 lb 3.2 oz (42.3 kg)  11/22/16 90 lb 6.4 oz (41 kg)  09/29/16 87 lb 12.8 oz (39.8 kg)   Constitutional: elderly female, thin built, frail with muscle wasting, in no acute distress. HEENT: Normocephalic and atraumatic. No pallor or icterus Neck: No lymphadenopathy Cardiac: Normal S1, S2. RRR, systolic murmur present. No leg edema.   Lungs: poor air entry, no wheeze, rhonchi or crackles Abdomen: bowel sounds present, soft, nontender Musculoskeletal: able to move all extremities, generalized weakness Skin: Warm and dry   Neurological: pleasantly confused   Labs CBC Latest Ref Rng & Units 12/27/2016 09/21/2016 05/28/2016  WBC 10:3/mL 8.1 7.4 6.8  Hemoglobin 12.0 - 16.0 g/dL 11.2(A) 10.7(A) 11.8(A)  Hematocrit 36 - 46 % 35(A) 34(A) 37  Platelets 150 - 399 K/L 312 373 324   CMP Latest Ref Rng & Units 12/27/2016 09/21/2016 03/22/2016  Glucose 65 - 99 mg/dL - - -  BUN 4 - 21 mg/dL 13 15 9   Creatinine 0.5 - 1.1 mg/dL 0.7 0.6 0.8  Sodium - 147 mmol/L 133(A) 136(A) 136(A)  Potassium 3.4 - 5.3 mmol/L 4.6 4.0 4.1  Chloride 101 - 111 mmol/L - - -  CO2 22 - 32 mmol/L - - -  Calcium 8.9 - 10.3 mg/dL - - -  Total Protein 6.5 - 8.1 g/dL - - -  Total Bilirubin 0.3 - 1.2 mg/dL - - -  Alkaline Phos 38 - 126 U/L - - -  AST 15 - 41 U/L - - -  ALT 14 - 54 U/L - - -   Lipid Panel     Component Value Date/Time   CHOL 169 01/09/2016   TRIG 90 01/09/2016   HDL 42 01/09/2016   LDLCALC 109 01/09/2016    Imaging  Right Elbow X-Ray 2 Views 07/17/16 Impression. Intra-articular olecranon fracture.  Right  Hip X-ray unilateral 2-3 Views with Pelvis 07/09/16 Impression. Negative right hip and pelvis. S/P ORIF of old healed left hip fracture. No change since 07-06-16  Left Hip X-Ray unilateral 2-3 Views with Pelvis 07/06/16 Impression. 1. No radiographic evidence of acute fracture or dislocation.  If symptoms persist, consider follow-up radiographs in order to evaluate for initial radio graphically occult fracture. 2. Status post left ORIF with intramedullary rod and screw fixation of a healed intertrochanteric fracture with near anatomic alignment. 3. Mild osteoporosis demonstrated. 4. Mild degenerative arthritis.     Assessment/plan  Urinary tract infection Has pulled out her IV line twice. Change her antibiotic to intramuscular from intravenous. Continue Rocephin 1 g IM every 24 hours until 01/01/2017. Monitor clinically.  Poor oral intake With failure to thrive. Has advanced dementia and decline as anticipated. Patient to be seen by palliative care team to review goals of care and focus in terms of comfort care.continue remeron.   Advanced dementia Supportive care to be provided. PICC line anticipated. Palliative care consult.   Severe protein calorie malnutrition decline anticipated, assist with feeding, monitor   [name], MD Internal Medicine [facility] [address] [place], [place] [address] Cell Phone (Monday-Friday 8 am - 5 pm): [phone] On Call: [phone] and follow prompts after 5 pm and on weekends Office Phone: [phone] Office Fax: [phone]

## 2017-03-09 DIAGNOSIS — R0602 Shortness of breath: Secondary | ICD-10-CM | POA: Diagnosis not present

## 2017-04-05 DIAGNOSIS — F0390 Unspecified dementia without behavioral disturbance: Secondary | ICD-10-CM | POA: Diagnosis not present

## 2017-04-05 DIAGNOSIS — K219 Gastro-esophageal reflux disease without esophagitis: Secondary | ICD-10-CM | POA: Diagnosis not present

## 2017-04-05 DIAGNOSIS — E559 Vitamin D deficiency, unspecified: Secondary | ICD-10-CM | POA: Diagnosis not present

## 2017-04-05 DIAGNOSIS — R630 Anorexia: Secondary | ICD-10-CM | POA: Diagnosis not present

## 2017-04-26 DIAGNOSIS — H25813 Combined forms of age-related cataract, bilateral: Secondary | ICD-10-CM | POA: Diagnosis not present

## 2017-05-02 DIAGNOSIS — M79674 Pain in right toe(s): Secondary | ICD-10-CM | POA: Diagnosis not present

## 2017-05-02 DIAGNOSIS — B351 Tinea unguium: Secondary | ICD-10-CM | POA: Diagnosis not present

## 2017-05-02 DIAGNOSIS — M79675 Pain in left toe(s): Secondary | ICD-10-CM | POA: Diagnosis not present

## 2017-05-17 DIAGNOSIS — R1311 Dysphagia, oral phase: Secondary | ICD-10-CM | POA: Diagnosis not present

## 2017-05-17 DIAGNOSIS — M6281 Muscle weakness (generalized): Secondary | ICD-10-CM | POA: Diagnosis not present

## 2017-05-17 DIAGNOSIS — R41841 Cognitive communication deficit: Secondary | ICD-10-CM | POA: Diagnosis not present

## 2017-05-18 DIAGNOSIS — F329 Major depressive disorder, single episode, unspecified: Secondary | ICD-10-CM | POA: Diagnosis not present

## 2017-05-18 DIAGNOSIS — R41841 Cognitive communication deficit: Secondary | ICD-10-CM | POA: Diagnosis not present

## 2017-05-18 DIAGNOSIS — F0390 Unspecified dementia without behavioral disturbance: Secondary | ICD-10-CM | POA: Diagnosis not present

## 2017-05-18 DIAGNOSIS — R1311 Dysphagia, oral phase: Secondary | ICD-10-CM | POA: Diagnosis not present

## 2017-05-18 DIAGNOSIS — M6281 Muscle weakness (generalized): Secondary | ICD-10-CM | POA: Diagnosis not present

## 2017-05-18 DIAGNOSIS — R630 Anorexia: Secondary | ICD-10-CM | POA: Diagnosis not present

## 2017-05-23 DIAGNOSIS — M6281 Muscle weakness (generalized): Secondary | ICD-10-CM | POA: Diagnosis not present

## 2017-05-23 DIAGNOSIS — R1311 Dysphagia, oral phase: Secondary | ICD-10-CM | POA: Diagnosis not present

## 2017-05-23 DIAGNOSIS — R41841 Cognitive communication deficit: Secondary | ICD-10-CM | POA: Diagnosis not present

## 2017-05-23 DIAGNOSIS — N3020 Other chronic cystitis without hematuria: Secondary | ICD-10-CM | POA: Diagnosis not present

## 2017-05-23 DIAGNOSIS — K219 Gastro-esophageal reflux disease without esophagitis: Secondary | ICD-10-CM | POA: Diagnosis not present

## 2017-05-30 DIAGNOSIS — M545 Low back pain: Secondary | ICD-10-CM | POA: Diagnosis not present

## 2017-05-30 DIAGNOSIS — R293 Abnormal posture: Secondary | ICD-10-CM | POA: Diagnosis not present

## 2017-05-30 DIAGNOSIS — M6281 Muscle weakness (generalized): Secondary | ICD-10-CM | POA: Diagnosis not present

## 2017-05-30 DIAGNOSIS — F0150 Vascular dementia without behavioral disturbance: Secondary | ICD-10-CM | POA: Diagnosis not present

## 2017-05-31 DIAGNOSIS — M6281 Muscle weakness (generalized): Secondary | ICD-10-CM | POA: Diagnosis not present

## 2017-05-31 DIAGNOSIS — R293 Abnormal posture: Secondary | ICD-10-CM | POA: Diagnosis not present

## 2017-05-31 DIAGNOSIS — M545 Low back pain: Secondary | ICD-10-CM | POA: Diagnosis not present

## 2017-05-31 DIAGNOSIS — F0150 Vascular dementia without behavioral disturbance: Secondary | ICD-10-CM | POA: Diagnosis not present

## 2017-06-03 DIAGNOSIS — M6281 Muscle weakness (generalized): Secondary | ICD-10-CM | POA: Diagnosis not present

## 2017-06-03 DIAGNOSIS — M545 Low back pain: Secondary | ICD-10-CM | POA: Diagnosis not present

## 2017-06-03 DIAGNOSIS — R293 Abnormal posture: Secondary | ICD-10-CM | POA: Diagnosis not present

## 2017-06-03 DIAGNOSIS — F0150 Vascular dementia without behavioral disturbance: Secondary | ICD-10-CM | POA: Diagnosis not present

## 2017-06-06 DIAGNOSIS — F0150 Vascular dementia without behavioral disturbance: Secondary | ICD-10-CM | POA: Diagnosis not present

## 2017-06-06 DIAGNOSIS — M059 Rheumatoid arthritis with rheumatoid factor, unspecified: Secondary | ICD-10-CM | POA: Diagnosis not present

## 2017-06-06 DIAGNOSIS — M545 Low back pain: Secondary | ICD-10-CM | POA: Diagnosis not present

## 2017-06-06 DIAGNOSIS — F0390 Unspecified dementia without behavioral disturbance: Secondary | ICD-10-CM | POA: Diagnosis not present

## 2017-06-06 DIAGNOSIS — R293 Abnormal posture: Secondary | ICD-10-CM | POA: Diagnosis not present

## 2017-06-06 DIAGNOSIS — E440 Moderate protein-calorie malnutrition: Secondary | ICD-10-CM | POA: Diagnosis not present

## 2017-06-06 DIAGNOSIS — D509 Iron deficiency anemia, unspecified: Secondary | ICD-10-CM | POA: Diagnosis not present

## 2017-06-06 DIAGNOSIS — M6281 Muscle weakness (generalized): Secondary | ICD-10-CM | POA: Diagnosis not present

## 2017-06-06 DIAGNOSIS — G894 Chronic pain syndrome: Secondary | ICD-10-CM | POA: Diagnosis not present

## 2017-06-09 DIAGNOSIS — F0150 Vascular dementia without behavioral disturbance: Secondary | ICD-10-CM | POA: Diagnosis not present

## 2017-06-09 DIAGNOSIS — R293 Abnormal posture: Secondary | ICD-10-CM | POA: Diagnosis not present

## 2017-06-09 DIAGNOSIS — M6281 Muscle weakness (generalized): Secondary | ICD-10-CM | POA: Diagnosis not present

## 2017-06-09 DIAGNOSIS — M545 Low back pain: Secondary | ICD-10-CM | POA: Diagnosis not present

## 2017-06-20 DIAGNOSIS — F0390 Unspecified dementia without behavioral disturbance: Secondary | ICD-10-CM | POA: Diagnosis not present

## 2017-06-20 DIAGNOSIS — R630 Anorexia: Secondary | ICD-10-CM | POA: Diagnosis not present

## 2017-06-20 DIAGNOSIS — F329 Major depressive disorder, single episode, unspecified: Secondary | ICD-10-CM | POA: Diagnosis not present

## 2017-07-25 DIAGNOSIS — M79675 Pain in left toe(s): Secondary | ICD-10-CM | POA: Diagnosis not present

## 2017-07-25 DIAGNOSIS — M79674 Pain in right toe(s): Secondary | ICD-10-CM | POA: Diagnosis not present

## 2017-07-25 DIAGNOSIS — B351 Tinea unguium: Secondary | ICD-10-CM | POA: Diagnosis not present

## 2017-08-03 DIAGNOSIS — I10 Essential (primary) hypertension: Secondary | ICD-10-CM | POA: Diagnosis not present

## 2017-08-14 DIAGNOSIS — R4182 Altered mental status, unspecified: Secondary | ICD-10-CM | POA: Diagnosis not present

## 2017-08-14 DIAGNOSIS — R531 Weakness: Secondary | ICD-10-CM | POA: Diagnosis not present

## 2017-08-16 DIAGNOSIS — F0390 Unspecified dementia without behavioral disturbance: Secondary | ICD-10-CM | POA: Diagnosis not present

## 2017-08-16 DIAGNOSIS — K219 Gastro-esophageal reflux disease without esophagitis: Secondary | ICD-10-CM | POA: Diagnosis not present

## 2017-08-16 DIAGNOSIS — E46 Unspecified protein-calorie malnutrition: Secondary | ICD-10-CM | POA: Diagnosis not present

## 2017-08-16 DIAGNOSIS — R630 Anorexia: Secondary | ICD-10-CM | POA: Diagnosis not present

## 2017-10-05 DIAGNOSIS — F329 Major depressive disorder, single episode, unspecified: Secondary | ICD-10-CM | POA: Diagnosis not present

## 2017-10-05 DIAGNOSIS — R630 Anorexia: Secondary | ICD-10-CM | POA: Diagnosis not present

## 2017-10-05 DIAGNOSIS — F0390 Unspecified dementia without behavioral disturbance: Secondary | ICD-10-CM | POA: Diagnosis not present

## 2017-11-02 DIAGNOSIS — F329 Major depressive disorder, single episode, unspecified: Secondary | ICD-10-CM | POA: Diagnosis not present

## 2017-11-02 DIAGNOSIS — R630 Anorexia: Secondary | ICD-10-CM | POA: Diagnosis not present

## 2017-11-02 DIAGNOSIS — F0390 Unspecified dementia without behavioral disturbance: Secondary | ICD-10-CM | POA: Diagnosis not present

## 2017-11-07 DIAGNOSIS — I739 Peripheral vascular disease, unspecified: Secondary | ICD-10-CM | POA: Diagnosis not present

## 2017-11-07 DIAGNOSIS — L84 Corns and callosities: Secondary | ICD-10-CM | POA: Diagnosis not present

## 2017-11-07 DIAGNOSIS — B351 Tinea unguium: Secondary | ICD-10-CM | POA: Diagnosis not present

## 2017-11-16 DIAGNOSIS — I10 Essential (primary) hypertension: Secondary | ICD-10-CM | POA: Diagnosis not present

## 2017-11-21 DIAGNOSIS — N3942 Incontinence without sensory awareness: Secondary | ICD-10-CM | POA: Diagnosis not present

## 2017-11-21 DIAGNOSIS — N3020 Other chronic cystitis without hematuria: Secondary | ICD-10-CM | POA: Diagnosis not present

## 2017-11-30 DIAGNOSIS — R630 Anorexia: Secondary | ICD-10-CM | POA: Diagnosis not present

## 2017-11-30 DIAGNOSIS — F329 Major depressive disorder, single episode, unspecified: Secondary | ICD-10-CM | POA: Diagnosis not present

## 2017-11-30 DIAGNOSIS — F0390 Unspecified dementia without behavioral disturbance: Secondary | ICD-10-CM | POA: Diagnosis not present

## 2017-12-02 DIAGNOSIS — R627 Adult failure to thrive: Secondary | ICD-10-CM | POA: Diagnosis not present

## 2017-12-02 DIAGNOSIS — F0390 Unspecified dementia without behavioral disturbance: Secondary | ICD-10-CM | POA: Diagnosis not present

## 2017-12-02 DIAGNOSIS — N390 Urinary tract infection, site not specified: Secondary | ICD-10-CM | POA: Diagnosis not present

## 2017-12-02 DIAGNOSIS — M059 Rheumatoid arthritis with rheumatoid factor, unspecified: Secondary | ICD-10-CM | POA: Diagnosis not present

## 2018-01-04 DIAGNOSIS — I10 Essential (primary) hypertension: Secondary | ICD-10-CM | POA: Diagnosis not present

## 2018-01-11 DIAGNOSIS — F329 Major depressive disorder, single episode, unspecified: Secondary | ICD-10-CM | POA: Diagnosis not present

## 2018-01-11 DIAGNOSIS — R630 Anorexia: Secondary | ICD-10-CM | POA: Diagnosis not present

## 2018-01-11 DIAGNOSIS — F0390 Unspecified dementia without behavioral disturbance: Secondary | ICD-10-CM | POA: Diagnosis not present

## 2018-01-13 DIAGNOSIS — E46 Unspecified protein-calorie malnutrition: Secondary | ICD-10-CM | POA: Diagnosis not present

## 2018-01-13 DIAGNOSIS — Z87440 Personal history of urinary (tract) infections: Secondary | ICD-10-CM | POA: Diagnosis not present

## 2018-01-13 DIAGNOSIS — G40901 Epilepsy, unspecified, not intractable, with status epilepticus: Secondary | ICD-10-CM | POA: Diagnosis not present

## 2018-01-13 DIAGNOSIS — F0390 Unspecified dementia without behavioral disturbance: Secondary | ICD-10-CM | POA: Diagnosis not present

## 2018-01-23 DIAGNOSIS — I739 Peripheral vascular disease, unspecified: Secondary | ICD-10-CM | POA: Diagnosis not present

## 2018-01-23 DIAGNOSIS — B351 Tinea unguium: Secondary | ICD-10-CM | POA: Diagnosis not present

## 2018-01-25 DIAGNOSIS — J449 Chronic obstructive pulmonary disease, unspecified: Secondary | ICD-10-CM | POA: Diagnosis not present

## 2018-01-25 DIAGNOSIS — R1311 Dysphagia, oral phase: Secondary | ICD-10-CM | POA: Diagnosis not present

## 2018-01-25 DIAGNOSIS — R293 Abnormal posture: Secondary | ICD-10-CM | POA: Diagnosis not present

## 2018-01-25 DIAGNOSIS — R41841 Cognitive communication deficit: Secondary | ICD-10-CM | POA: Diagnosis not present

## 2018-01-25 DIAGNOSIS — M6281 Muscle weakness (generalized): Secondary | ICD-10-CM | POA: Diagnosis not present

## 2018-01-27 DIAGNOSIS — R293 Abnormal posture: Secondary | ICD-10-CM | POA: Diagnosis not present

## 2018-01-27 DIAGNOSIS — J449 Chronic obstructive pulmonary disease, unspecified: Secondary | ICD-10-CM | POA: Diagnosis not present

## 2018-01-27 DIAGNOSIS — R41841 Cognitive communication deficit: Secondary | ICD-10-CM | POA: Diagnosis not present

## 2018-01-27 DIAGNOSIS — R1311 Dysphagia, oral phase: Secondary | ICD-10-CM | POA: Diagnosis not present

## 2018-01-27 DIAGNOSIS — M6281 Muscle weakness (generalized): Secondary | ICD-10-CM | POA: Diagnosis not present

## 2018-01-30 DIAGNOSIS — R1311 Dysphagia, oral phase: Secondary | ICD-10-CM | POA: Diagnosis not present

## 2018-01-30 DIAGNOSIS — R293 Abnormal posture: Secondary | ICD-10-CM | POA: Diagnosis not present

## 2018-01-30 DIAGNOSIS — R41841 Cognitive communication deficit: Secondary | ICD-10-CM | POA: Diagnosis not present

## 2018-01-30 DIAGNOSIS — J449 Chronic obstructive pulmonary disease, unspecified: Secondary | ICD-10-CM | POA: Diagnosis not present

## 2018-01-30 DIAGNOSIS — M6281 Muscle weakness (generalized): Secondary | ICD-10-CM | POA: Diagnosis not present

## 2018-01-31 DIAGNOSIS — I10 Essential (primary) hypertension: Secondary | ICD-10-CM | POA: Diagnosis not present

## 2018-02-01 DIAGNOSIS — R41841 Cognitive communication deficit: Secondary | ICD-10-CM | POA: Diagnosis not present

## 2018-02-01 DIAGNOSIS — M6281 Muscle weakness (generalized): Secondary | ICD-10-CM | POA: Diagnosis not present

## 2018-02-01 DIAGNOSIS — R1311 Dysphagia, oral phase: Secondary | ICD-10-CM | POA: Diagnosis not present

## 2018-02-01 DIAGNOSIS — J449 Chronic obstructive pulmonary disease, unspecified: Secondary | ICD-10-CM | POA: Diagnosis not present

## 2018-02-01 DIAGNOSIS — R293 Abnormal posture: Secondary | ICD-10-CM | POA: Diagnosis not present

## 2018-02-03 DIAGNOSIS — R293 Abnormal posture: Secondary | ICD-10-CM | POA: Diagnosis not present

## 2018-02-03 DIAGNOSIS — R41841 Cognitive communication deficit: Secondary | ICD-10-CM | POA: Diagnosis not present

## 2018-02-03 DIAGNOSIS — M6281 Muscle weakness (generalized): Secondary | ICD-10-CM | POA: Diagnosis not present

## 2018-02-03 DIAGNOSIS — J449 Chronic obstructive pulmonary disease, unspecified: Secondary | ICD-10-CM | POA: Diagnosis not present

## 2018-02-03 DIAGNOSIS — R1311 Dysphagia, oral phase: Secondary | ICD-10-CM | POA: Diagnosis not present

## 2018-02-07 DIAGNOSIS — R41841 Cognitive communication deficit: Secondary | ICD-10-CM | POA: Diagnosis not present

## 2018-02-07 DIAGNOSIS — R1311 Dysphagia, oral phase: Secondary | ICD-10-CM | POA: Diagnosis not present

## 2018-02-07 DIAGNOSIS — M6281 Muscle weakness (generalized): Secondary | ICD-10-CM | POA: Diagnosis not present

## 2018-02-07 DIAGNOSIS — J449 Chronic obstructive pulmonary disease, unspecified: Secondary | ICD-10-CM | POA: Diagnosis not present

## 2018-02-07 DIAGNOSIS — R293 Abnormal posture: Secondary | ICD-10-CM | POA: Diagnosis not present

## 2018-02-08 DIAGNOSIS — R630 Anorexia: Secondary | ICD-10-CM | POA: Diagnosis not present

## 2018-02-08 DIAGNOSIS — F0390 Unspecified dementia without behavioral disturbance: Secondary | ICD-10-CM | POA: Diagnosis not present

## 2018-02-08 DIAGNOSIS — F329 Major depressive disorder, single episode, unspecified: Secondary | ICD-10-CM | POA: Diagnosis not present

## 2018-02-10 DIAGNOSIS — N390 Urinary tract infection, site not specified: Secondary | ICD-10-CM | POA: Diagnosis not present

## 2018-03-28 DIAGNOSIS — F419 Anxiety disorder, unspecified: Secondary | ICD-10-CM | POA: Diagnosis not present

## 2018-03-28 DIAGNOSIS — I10 Essential (primary) hypertension: Secondary | ICD-10-CM | POA: Diagnosis not present

## 2018-03-28 DIAGNOSIS — F0390 Unspecified dementia without behavioral disturbance: Secondary | ICD-10-CM | POA: Diagnosis not present

## 2018-03-28 DIAGNOSIS — F329 Major depressive disorder, single episode, unspecified: Secondary | ICD-10-CM | POA: Diagnosis not present

## 2018-05-01 DIAGNOSIS — K219 Gastro-esophageal reflux disease without esophagitis: Secondary | ICD-10-CM | POA: Diagnosis not present

## 2018-05-01 DIAGNOSIS — M059 Rheumatoid arthritis with rheumatoid factor, unspecified: Secondary | ICD-10-CM | POA: Diagnosis not present

## 2018-05-01 DIAGNOSIS — F440 Dissociative amnesia: Secondary | ICD-10-CM | POA: Diagnosis not present

## 2018-05-01 DIAGNOSIS — F0390 Unspecified dementia without behavioral disturbance: Secondary | ICD-10-CM | POA: Diagnosis not present

## 2018-05-01 DIAGNOSIS — M810 Age-related osteoporosis without current pathological fracture: Secondary | ICD-10-CM | POA: Diagnosis not present

## 2018-05-29 DIAGNOSIS — E559 Vitamin D deficiency, unspecified: Secondary | ICD-10-CM | POA: Diagnosis not present

## 2018-05-30 DIAGNOSIS — F329 Major depressive disorder, single episode, unspecified: Secondary | ICD-10-CM | POA: Diagnosis not present

## 2018-05-30 DIAGNOSIS — F0390 Unspecified dementia without behavioral disturbance: Secondary | ICD-10-CM | POA: Diagnosis not present

## 2018-05-30 DIAGNOSIS — F419 Anxiety disorder, unspecified: Secondary | ICD-10-CM | POA: Diagnosis not present

## 2018-05-30 DIAGNOSIS — R630 Anorexia: Secondary | ICD-10-CM | POA: Diagnosis not present

## 2018-06-19 DEATH — deceased

## 2018-08-02 ENCOUNTER — Encounter: Payer: Self-pay | Admitting: Internal Medicine
